# Patient Record
Sex: Female | Born: 1970 | Race: White | Hispanic: No | Marital: Single | Smoking: Never smoker
Health system: Southern US, Academic
[De-identification: ages and names within clinical notes are randomized; demographics above are authoritative.]

## PROBLEM LIST (undated history)

## (undated) DIAGNOSIS — M722 Plantar fascial fibromatosis: Secondary | ICD-10-CM

## (undated) DIAGNOSIS — O009 Unspecified ectopic pregnancy without intrauterine pregnancy: Secondary | ICD-10-CM

## (undated) DIAGNOSIS — C801 Malignant (primary) neoplasm, unspecified: Secondary | ICD-10-CM

## (undated) HISTORY — PX: HX BREAST BIOPSY: SHX20

## (undated) HISTORY — PX: HX OTHER: 2100001105

## (undated) HISTORY — DX: Unspecified ectopic pregnancy without intrauterine pregnancy: O00.90

## (undated) HISTORY — DX: Plantar fascial fibromatosis: M72.2

## (undated) HISTORY — PX: OTHER SURGICAL HISTORY: SHX169

---

## 1997-09-25 DIAGNOSIS — C439 Malignant melanoma of skin, unspecified: Secondary | ICD-10-CM

## 1997-09-25 HISTORY — DX: Malignant melanoma of skin, unspecified (CMS HCC): C43.9

## 1998-09-25 HISTORY — PX: HX REFRACTIVE SURGERY: SHX103

## 2003-09-26 HISTORY — PX: HX MYOMECTOMY: SHX85

## 2010-05-26 NOTE — Progress Notes (Signed)
Natasha Perez is a 39 y.o. female here to establish care.     Chief Complaint: Chief Complaint   Patient presents with   . Establish Care         HPI:    Sees Dr. Cephus Richer in [place] for ob/gyn. Just moved back to [place] after mom died and is keeping old doctor.  She also continues to see a derm in [place] for her history of melanoma.      No problems now but occasionally gets back spasms, sometimes during her hobby of scuba diving and needs flexeril.  Used to keep some but then it started expiring bc not being used often enough.     Works doing pediatric cardiology ultrasound, leaning over a lot, gives her some back pain.  Back goes out sometimes and has to take muscle relaxer or gets torticollis.       ZOX:WRUE Medical History   Diagnosis Date   . Melanoma 1999     right thigh, had 2 or 3 surgeries at [facility], sampled lymph nodes which were ok, still sees dermatologist yearly in [facility]       AVW:UJWJ Surgical History   Procedure Date   . Hx other      3 right leg surgeries for melanoma   . Hx myomectomy 2005   . Hx refractive surgery        Medications:  Current outpatient prescriptions   Medication Sig   . cholecalciferol, vitamin D3, 10,000 unit Cap take 5 Caps by mouth Q7 Days.   . multivitamin Tab take 1 Tab by mouth Once a day.         Allergies:Allergies   Allergen Reactions   . Sulfa (Sulfonamides) Nausea/ Vomiting   . Iodine-iodine Containing Itching, Swelling and Hives/ Urticaria       OB History    Grav Para Term Preterm Abortions TAB SAB Ect Mult Living    0 0                    Social history: History   Social History   . Marital Status: Single     Spouse Name: N/A     Number of Children: 0   . Years of Education: N/A   Occupational History   .  [employer].   Social History Main Topics   . Smoking status: Never Smoker    . Smokeless tobacco: Not on file   . Alcohol Use: No   . Drug Use: Not on file   . Sexually Active: Yes -- Female partner(s)   Other Topics Concern    . Exercise Concern No     Not getting as much exercise as she thinks she should.   Marland Kitchen Special Diet No     Only eating one meal a day now. Dinner is hit or miss.  Needs to eat more regularly she says.    Social History Narrative    Lives in [place].  No children.  Works here in pediatric cardiology. Brendolyn Patty is scuba diving.  Was an only child.  Zip lining for birthday. Got undergraduate degree at [employer] and a math degree at [employer].          Family history: Family History   Problem Relation Age of Onset   . Hypertension Mother    . Diabetes Mother      also in mother's grandmother   . Lung Cancer Maternal Grandmother    . Stroke  Mother 9     died of brainstem stroke       Review of systems:  Const: No constitutional symptoms of fevers, chills, or night sweats.  Eyes: No changes in visual acuity or diplopia now but had lasik surg.  ENT: No ear pain or sinus problems.    Skin: history of melanoma  GI: No abdominal pain, nausea or vomiting, black or bloody stools.  GU: Was irregular when younger, now is regular.  Cramps a lot so takes ibuprofen.  Tried COCPs for regulation.   Heme: No history of anemia or bleeding disorder.   Lungs: No dyspnea or wheezing.  Heart: No chest pain, palpitations, or history of heart murmur.  Musculoskeletal: +back pain  Psych: No history of depression or anxiety requiring treatment. Feels she is coping with her mother's death ok.    Physical Examination:    BP 122/86   Pulse 68   Temp(Src) 36.9 C (98.5 F) (Tympanic)   Ht 1.778 m (5\' 10" )   Wt 96.389 kg (212 lb 8 oz)   BMI 30.49 kg/m2   LMP 05/09/2010    Body mass index is 30.49 kg/(m^2).    General appearance  Alert, cooperative, no distress.   Head  Normocephalic, without obvious abnormality, atraumatic.   Eyes  Conjunctivae/corneas clear. PERRL, EOM's intact.    Ears  Normal TM's and external ear canals.   Nose Nares normal. Septum midline. Mucosa normal.    Throat Lips, mucosa, and tongue normal. Teeth and gums normal    Neck Supple, trachea midline, no adenopathy, thyroid: not enlarged, no nodules.   Lungs   Clear to auscultation bilaterally.   Heart  Regular rate and rhythm, S1, S2 normal, no murmur, rub or gallop.   Abdomen   Soft, non-tender. Bowel sounds normal. No masses,  No organomegaly.   Extremities Extremities normal, atraumatic, no cyanosis or edema.   Pulses 2+ and symmetric   Skin Skin color, texture, turgor normal. No rashes or lesions.   Neurologic Cranial nerves II-XII intact grossly.       Labs:  No results found for any previous visit.        Assessment/plan:  39 year old with history of melanoma that is followed annually by derm in [place] is here to establish:     Back pain at times.  May occasionally need flexeril she says.    Vitamin D deficiency. Taking 50,000 units weekly based on blood work she had done elsewhere.  She will try to get records for me.    Menstrual cramps and history of fibroid. S/p surg for fibroid.  Takes ibuprofen prn cramping and still sees her old ob/gyn in [place].    Weight. Usually around 180 pounds but has recently gained and is in obese range by bmi.  Body mass index is 30.49 kg/(m^2).  Weight: 96.389 kg (212 lb 8 oz)   Contraception. Not on any birth control and would love to get pregnant.  Advised to make sure she gets adequate folic acid just in case.    Prevention.  Last pap.  Does with gyn in [place].  Gets every year.  Had to repeat one once for abnormality and just had it May 2011.  Last mammogram.  Haven't started yet.  Other doctor ordered it but she hasn't done it yet. Ordered it here.   Colon cancer screening.  N/A.  Osteoporosis prevention.  No BMD.  Calcium encouraged.  Drinks a lot of milk.   Lipid screening.  Had it done and lipids were never an issue.    Immunizations.   Flu. Gets in the fall through work.   Pneumovax. N/A.   Tetanus. Is current. Doesn't know date though but says that employee health made her get adacel too. 2011.   Shingles. N/A.      Orders Placed This Encounter   . MAMMO BILATERAL SCREENING       There are no Patient Instructions on file for this visit.    Follow-up in 1 year or prn.        Christianne Dolin, MD, MPH  Associate Professor  [place] [facility] Medicine  Clinic Director, [facility]

## 2010-05-27 ENCOUNTER — Other Ambulatory Visit (INDEPENDENT_AMBULATORY_CARE_PROVIDER_SITE_OTHER): Payer: Self-pay | Admitting: Internal Medicine

## 2010-05-27 ENCOUNTER — Ambulatory Visit (INDEPENDENT_AMBULATORY_CARE_PROVIDER_SITE_OTHER): Payer: BLUE CROSS/BLUE SHIELD | Admitting: Internal Medicine

## 2010-05-27 ENCOUNTER — Ambulatory Visit (HOSPITAL_COMMUNITY): Payer: BLUE CROSS/BLUE SHIELD

## 2010-05-27 ENCOUNTER — Ambulatory Visit
Admission: RE | Admit: 2010-05-27 | Discharge: 2010-05-27 | Disposition: A | Discharge status: Home / Self Care | Payer: BLUE CROSS/BLUE SHIELD | Attending: Internal Medicine | Admitting: Internal Medicine

## 2010-05-27 ENCOUNTER — Encounter (INDEPENDENT_AMBULATORY_CARE_PROVIDER_SITE_OTHER): Payer: Self-pay | Admitting: Internal Medicine

## 2010-05-27 ENCOUNTER — Ambulatory Visit (HOSPITAL_BASED_OUTPATIENT_CLINIC_OR_DEPARTMENT_OTHER)
Admission: RE | Admit: 2010-05-27 | Discharge: 2010-05-27 | Disposition: A | Discharge status: Home / Self Care | Payer: BLUE CROSS/BLUE SHIELD | Source: Ambulatory Visit | Attending: Internal Medicine | Admitting: Internal Medicine

## 2010-05-27 VITALS — BP 122/86 | HR 68 | Temp 98.5°F | Ht 70.0 in | Wt 212.5 lb

## 2010-05-27 DIAGNOSIS — Z1231 Encounter for screening mammogram for malignant neoplasm of breast: Secondary | ICD-10-CM | POA: Insufficient documentation

## 2010-05-27 DIAGNOSIS — R928 Other abnormal and inconclusive findings on diagnostic imaging of breast: Secondary | ICD-10-CM

## 2010-05-27 DIAGNOSIS — Z85820 Personal history of malignant melanoma of skin: Secondary | ICD-10-CM | POA: Insufficient documentation

## 2010-07-19 ENCOUNTER — Encounter: Payer: Self-pay | Admitting: Internal Medicine

## 2010-07-20 ENCOUNTER — Encounter (INDEPENDENT_AMBULATORY_CARE_PROVIDER_SITE_OTHER): Payer: Self-pay | Admitting: Internal Medicine

## 2010-07-20 NOTE — Telephone Encounter (Signed)
Message copied by Raford Pitcher on Wed Jul 20, 2010 11:48 AM  ------       Message from: Richrd Prime       Created: Wed Jul 20, 2010  9:02 AM       Regarding: FW: Prescription Question       Contact: [phone]                       ----- Message -----          From: Quentin Angst          Sent: 07/20/2010   8:53 AM            To: [facility]       Subject: Prescription Question                                      Good morning maam,              I went to refill my perscription of vitD 50000 units at the [facility] pharmacy and they wouldnt refill it because the year had expired from my previous PCP([place]) although there were still refills.              I recently moved here and got established with you back in Sept could you please write me a perscription for:               VIT D 50000 units SOFT Gel (1 capsule every week) with refills.                Any questions please feel free to call me [phone].                     Thanks for your time       Make it a great day!!!!!              Natasha Perez :)

## 2010-07-21 ENCOUNTER — Ambulatory Visit
Admission: RE | Admit: 2010-07-21 | Discharge: 2010-07-21 | Disposition: A | Discharge status: Home / Self Care | Payer: BLUE CROSS/BLUE SHIELD | Attending: Internal Medicine | Admitting: Internal Medicine

## 2010-07-21 ENCOUNTER — Encounter (INDEPENDENT_AMBULATORY_CARE_PROVIDER_SITE_OTHER): Payer: Self-pay | Admitting: Internal Medicine

## 2010-07-21 ENCOUNTER — Ambulatory Visit (INDEPENDENT_AMBULATORY_CARE_PROVIDER_SITE_OTHER): Payer: Self-pay | Admitting: Internal Medicine

## 2010-07-21 ENCOUNTER — Ambulatory Visit: Payer: Self-pay

## 2010-07-21 DIAGNOSIS — E559 Vitamin D deficiency, unspecified: Secondary | ICD-10-CM | POA: Insufficient documentation

## 2010-07-21 DIAGNOSIS — Z139 Encounter for screening, unspecified: Secondary | ICD-10-CM | POA: Insufficient documentation

## 2010-07-21 LAB — GLUCOSE FASTING: GLUCOSE, FASTING: 92 mg/dL (ref 70–105)

## 2010-07-21 LAB — LIPID PANEL
CHOLESTEROL: 142 mg/dL (ref <200)
HDL-CHOLESTEROL: 52 mg/dL (ref >39)
LDL (CALCULATED): 79 mg/dL (ref <100)
NON - HDL (CALCULATED): 90 mg/dL (ref <190)
TRIGLYCERIDES: 53 mg/dL (ref <150)
VLDL (CALCULATED): 11 mg/dL (ref <30)

## 2010-07-21 NOTE — Telephone Encounter (Signed)
Message copied by Richrd Prime on Thu Jul 21, 2010  2:44 PM  ------       Message from: Durenda Age       Created: Thu Jul 21, 2010  1:53 PM         >> [facility] 07/21/2010 01:53 PM       Dr.Davisson       Lab at the hospital is calling stating that the order for the vitamin d they dont have a gold and cannot run that on the pt and they wanted to let you know that

## 2010-07-22 ENCOUNTER — Telehealth (INDEPENDENT_AMBULATORY_CARE_PROVIDER_SITE_OTHER): Payer: Self-pay | Admitting: Internal Medicine

## 2010-07-22 MED ORDER — ERGOCALCIFEROL (VITAMIN D2) 1,250 MCG (50,000 UNIT) CAPSULE
50000.00 [IU] | ORAL_CAPSULE | ORAL | Status: DC
Start: 2010-07-22 — End: 2011-08-16

## 2010-07-22 NOTE — Telephone Encounter (Signed)
Message copied by Raford Pitcher on Fri Jul 22, 2010  2:02 PM  ------       Message from: Luanne Bras       Created: Fri Jul 22, 2010 12:41 PM       Regarding: RE: Non-Urgent Medical Question       Contact: [phone]         I had my blood drawn yesterday afternoon(they had to stick me 4 times for it)  if by chance you would like to run any other tests off that tube of blood I would appreciate it!!!!...Marland KitchenMarland KitchenHave you received the results.... I really would like to get my prescription renewal today since its the wkend and i usually take the vit d on wed...so Im a little late this week.              Thanks again and so sorry to bother so much regarding this minor issue.Marland KitchenMarland KitchenMarland KitchenIm sure you have much bigger issues to deal with.              Make it a great day!!!  Natasha Perez :)              ----- Message -----          From: Raford Pitcher, MD          Sent: 07/21/2010 4:32 PM            To: Natasha Perez       Subject: RE: Non-Urgent Medical Question               I sent you another message saying that I would try to add it on but the lab called and said they don't have the right tube so you will need to be stuck anyway.                       ----- Message -----          From: Natasha Perez          Sent: 07/21/10 01:41 PM            To: Raford Pitcher, MD       Subject: Non-Urgent Medical Question              will do!!  Have a great afternoon :)              ----- Message -----          From: Raford Pitcher, MD          Sent: 07/21/2010 1:35 PM            To: Natasha Perez       Subject: RE: Non-Urgent Medical Question                No, we can't do that to my knowledge.  Blood work that you have done through the wellness program doesn't go into your chart as a patient so that's why it won't work.  In other words, I can't add blood work as a "blood in lab" because according to your patient record, there is no blood in the lab from today.  Sorry!  I hate that you have to get stuck again.  (And for the same reason, please get me a copy of the results from the wellness program because otherwise I won't see them.)                            -----  Message -----          From: Natasha Perez          Sent: 07/21/10 12:49 PM            To: Raford Pitcher, MD       Subject: Non-Urgent Medical Question              Good Afternoon,                Question for ya....i had blood drawn this am(fasting) for the wellness thing....can u run the vit D off of that same tube without me having to be stuck again today....please let me know.Marland KitchenMarland KitchenThanks for your time Spiro :)

## 2010-07-25 ENCOUNTER — Encounter (INDEPENDENT_AMBULATORY_CARE_PROVIDER_SITE_OTHER): Payer: Self-pay | Admitting: Internal Medicine

## 2010-07-25 LAB — VITAMIN D, SERUM (25 HYDROXYVITAMIN D2 AND D3 BY MS)
25 HYDROXYVITAMIN D2/D3,total: 52.9 ng/mL
25 HYDROXYVITAMIN D2: 47.9 ng/mL
25 HYDROXYVITAMIN D3: 5 ng/mL

## 2010-10-14 ENCOUNTER — Ambulatory Visit: Payer: 59 | Attending: Internal Medicine | Admitting: Internal Medicine

## 2010-10-14 ENCOUNTER — Encounter (INDEPENDENT_AMBULATORY_CARE_PROVIDER_SITE_OTHER): Payer: Self-pay | Admitting: Internal Medicine

## 2010-10-14 VITALS — BP 132/88 | HR 64 | Temp 97.6°F | Ht 70.0 in | Wt 207.0 lb

## 2010-10-14 DIAGNOSIS — M62838 Other muscle spasm: Secondary | ICD-10-CM | POA: Insufficient documentation

## 2010-10-14 DIAGNOSIS — M25519 Pain in unspecified shoulder: Secondary | ICD-10-CM | POA: Insufficient documentation

## 2010-10-14 MED ORDER — METAXALONE 800 MG TABLET
800.00 mg | ORAL_TABLET | Freq: Three times a day (TID) | ORAL | Status: DC
Start: 2010-10-14 — End: 2011-02-22

## 2010-10-14 MED ORDER — CYCLOBENZAPRINE 10 MG TABLET
10.00 mg | ORAL_TABLET | Freq: Three times a day (TID) | ORAL | Status: DC
Start: 2010-10-14 — End: 2011-08-30

## 2010-10-14 MED ORDER — ETODOLAC 400 MG TABLET
400.00 mg | ORAL_TABLET | Freq: Two times a day (BID) | ORAL | Status: DC
Start: 2010-10-14 — End: 2011-02-22

## 2010-10-14 NOTE — Progress Notes (Signed)
Subjective:      Natasha Perez is a 40 y.o. female here for acute visit for right sided shoulder/back/neck muscle spasm.  This is the same thing that has happened to her multiple times in the past.  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  She says that right now it is not at its worst.  She reports the pain was an 8 or 9 out of 10.  Now it is down to a 5, after taking a friend's skelaxin.  She reports that she has iced it and has taken ibuprofen 800mg  every 4 hours with minimal relief.  She reports that flexeril usually works the best but it is hard for her to work on it.  The skelaxin seems to not make her feel as loopy.  She reports that in the past she has done PT for this problem and has some exercises that she tries to continue to do.  She reports that she hopes they will let her exchange tasks at work with somebody else this afternoon.  If not, and if she has to do the portables, she thinks she will have to take off the rest of the afternoon.  She plans to rest and recover over the weekend.  She wanted Monday off in case she needs it, but she wants to return to work on Monday if she is better.       Patient's last menstrual period was 09/25/2010.        Current outpatient prescriptions   Medication Sig   . cyclobenzaprine (FLEXERIL) 10 mg Oral Tablet take 1 Tab by mouth Three times a day.   . Metaxalone 800 mg Oral Tablet take 800 mg by mouth Three times a day.   . Etodolac (LODINE) 400 mg Oral Tablet take 1 Tab by mouth Twice daily.   . ergocalciferol, vitamin D2, (DRISDOL) 50,000 unit Oral Capsule take 1 Cap by mouth Q7 Days.   . cholecalciferol, vitamin D3, 10,000 unit Cap take 5 Caps by mouth Q7 Days.   . multivitamin Tab take 1 Tab by mouth Once a day.           Objective:     BP 132/88   Pulse 64   Temp(Src) 36.4 C (97.6 F) (Tympanic)   Ht 1.778 m (5\' 10" )   Wt 93.895 kg (207 lb)   BMI 29.70 kg/m2   LMP 09/25/2010     Body mass index is 29.70 kg/(m^2).       General: Visibly stiff.   Pain with palpation of the right side of her back in the mid-scapula area.  Not tender in the right neck.    Range of motion of her neck is limited: she can turn to the left and right only to about 45 degrees.        Labs:  Hospital Outpatient Visit on 07/21/2010   Component Date Value   . 25 HYDROXYVITAMIN D2 07/21/2010 47.9    . 25 HYDROXYVITAMIN D3 07/21/2010 5.0    . 25 HYDROXYVITAMIN D2/D3,* 07/21/2010 52.9    Office Visit on 07/21/2010   Component Date Value   . GLUCOSE, FASTING 07/21/2010 92    . TRIGLYCERIDES 07/21/2010 53    . CHOLESTEROL 07/21/2010 142    . HDL-CHOLESTEROL 07/21/2010 52    . LDL (CALCULATED) 07/21/2010 79    . VLDL (CALCULATED) 07/21/2010 11    . NON - HDL (CALCULATED) 07/21/2010 90  Assessment/Plan:     40 year old woman with a history of melanoma is here for an acute visit for right sided muscle spasm.  She was given a prescription for lodine to try instead of the ibuprofen.  It was emphasized to her to not combine the two meds.  She can combine the nsaid with flexeril though and the flexeril prescription was also given.  A few skelaxin were also given because she feels less loopy on them, but it was emphasized to her not to combine the flexeril and skelaxin.  A work excuse was given.  If she doesn't improve next week and would like to try something else like physical therapy again, she is to contact me.         Orders Placed This Encounter   . cyclobenzaprine (FLEXERIL) 10 mg Oral Tablet   . Metaxalone 800 mg Oral Tablet   . Etodolac (LODINE) 400 mg Oral Tablet   . RETURN TO WORK/SCHOOL         There are no Patient Instructions on file for this visit.    Follow-up prn.     Christianne Dolin, MD, MPH  Associate Professor  [place] Department of Medicine  Clinic Director, [facility]

## 2011-02-22 ENCOUNTER — Ambulatory Visit (INDEPENDENT_AMBULATORY_CARE_PROVIDER_SITE_OTHER): Payer: 59

## 2011-02-22 ENCOUNTER — Encounter (INDEPENDENT_AMBULATORY_CARE_PROVIDER_SITE_OTHER): Payer: Self-pay

## 2011-02-22 VITALS — BP 124/87 | HR 68 | Temp 97.9°F | Resp 18 | Wt 216.1 lb

## 2011-02-22 MED ORDER — VALACYCLOVIR 1 GRAM TABLET
1000.00 mg | ORAL_TABLET | ORAL | Status: AC
Start: 2011-02-22 — End: 2011-03-01

## 2011-02-22 MED ORDER — PREDNISONE 20 MG TABLET
20.00 mg | ORAL_TABLET | ORAL | Status: DC
Start: 2011-02-22 — End: 2011-06-28

## 2011-02-22 NOTE — Patient Instructions (Signed)
 Shingles  (Herpes Zoster)  Shingles is caused by the same virus that causes chicken pox. The first feelings may be pain or tingling. A rash will follow in a couple days. The rash may occur on any area of the body. Long-lasting pain is more likely in an elderly person. It can last months to years. There are medicines that can help prevent pain if you start taking them early.  HOME CARE   Place cool cloths on the rash.    Only take medicine as told by your doctor.    Calamine lotion can be used to relieve itchy skin.    Avoid touching:    Babies.    Children with inflamed skin (eczema).    People who have gotten transplanted organs.    People with chronic illnesses, such as leukemia and AIDS.    If the rash is on the face, you may be told to see a specialist. It is very important to keep all appointments. Shingles must be kept away from the eyes, if possible.   GET HELP RIGHT AWAY IF:   There is any pain on the face or eye. This must be followed carefully by your doctor or eye doctor. An infection of part of the eye (cornea) can be very serious. It could lead to blindness.    The medicines do not help.    The redness or puffiness (swelling) spreads.    You or your child has a temperature by mouth above 101, not controlled by medicine.    Your baby is older than 3 months with a rectal temperature of 102 F (38.9 C) or higher.    Your baby is 47 months old or younger with a rectal temperature of 100.4 F (38 C) or higher.    You notice any red lines going away from the rash area.   MAKE SURE YOU:   Understand these instructions.    Will watch this condition.    Will get help right away if you or your child is not doing well or gets worse.   Document Released: 02/28/2008 Document Re-Released: 12/06/2009  [facility] Patient Information 2011 [place], [place].    Orders Placed This Encounter   . Valacyclovir  (VALTREX ) 1 g Oral Tablet   . predniSONE  (DELTASONE ) 20 mg Oral Tablet           ________________________________________________________________________  Short Term Disability and Family Medical Leave Act  [facility] does NOT provide assistance with any disability applications.  If you feel your medical condition requires you to be on disability, you will need to follow up with  Your primary care physician or a specialist.  We apologize for any inconvenience.    For Medication Prescribed by [facility]:  As an Urgent Care facility, our clinic does NOT offer prescription refills over the telephone.    If you need more of the medication one of our medical providers prescribed, you will  Either need to be re-evaluated by us  or see your primary care physician.    ________________________________________________________________________      It is very important that we have a phone number that is the single best way to contact you in the event that we become aware of important clinical information or concerns after your discharge.  If the phone number you provided at registration is NOT this number you should inform staff and registration prior to leaving.      Your treatment and evaluation today was focused on identifying and treating  potentially emergent conditions based on your presenting signs, symptoms, and history.  The resulting initial clinical impression and treatment plan is not intended to be definitive or a substitute for a full physical examination and evaluation by your primary care provider.  If your symptoms persist, worsen, or you develop any new or concerning symptoms, you need to be evaluated.      If you received x-rays during your visit, be aware that the final and formal interpretation of those films by a radiologist may occur after your discharge.  If there is a significant discrepancy identified after your discharge, we will contact you at th telephone number provided at registration.      If you received a pelvic exam, you may have cultures pending for sexually  transmitted diseases.  Positive cultures are reported to the [facility] Department of Health as required by state law.  You should be contacted if you cultures are positive.  We will not contact you if they are negative.  You may contact the Health Information Management Office of [facility] to get a copy of your results.  You did NOT receive a PAP smear (the screening test for cervical).  This specific test for women is best performed by your gynecologist or primary care provider when indicated.      If you are over 53 year old, we cannot discuss your personal health information with a parent, spouse, family member, or anyone else without your express consent.  This does not include those who have legitimate access to your records and information to assist in your care under the provisions of HIPAA [facility] Portability and Accountability Act) law, or those to whom you have previously given express written consent to do so, such a legal guardian or Power of [place].      You may have received medication that may cause you to feel drowsy and/or light headed for several hours.  You may even experience some amnesia of your stay.  You should avoid operating a motor vehicle or performing any activity requiring complete alertness or coordination until you feel fully awake (approximately 24-48 hours).  Avoid alcoholic beverages.  You may also have a dry mouth for several hours.  This is a normal side effect and will disappear as the effects of the medication wear off.      Instructions discussed with patient upon discharge by clinical staff with all questions answered.  Please call [facility] [phone]) if any further questions.  Go immediately to the emergency department if any concern or worsening symptoms.        Tiron Suski Marie Lora Glomski, PA-C 02/22/2011, 9:30 AM

## 2011-02-22 NOTE — Progress Notes (Signed)
History of Present Illness: Natasha Perez is a 40 y.o. female who presents to the Urgent Care today with chief complaint of Shingles.  Patient states she began 5 days ago with bad headache and feeling fatigue.  She developed pain along right side of face and ear.  Two days ago she developed small blisters around right ear lobe and behind ear.  She notes pain associated with rash.  Patient has taken ibuprofen for pain and has history of varicella.    I reviewed and confirmed the patient's past medical history taken by the nurse or medical assistant with the addition of the following:    Past Medical History:    Past Medical History   Diagnosis Date   . Melanoma 1999     right thigh, had 2 or 3 surgeries at [facility], sampled lymph nodes which were ok, still sees dermatologist yearly in [place]       Past Surgical History:    Past Surgical History   Procedure Date   . Hx other      3 right leg surgeries for melanoma   . Hx myomectomy 2005   . Hx refractive surgery        Allergies:  Allergies   Allergen Reactions   . Sulfa (Sulfonamides) Nausea/ Vomiting   . Iodine-iodine Containing Itching, Swelling and Hives/ Urticaria       Medications:    Current outpatient prescriptions   Medication Sig   . prenatal vitamin-iron-folate (NATALCARE PLUS) 27-0.8 mg Oral Tablet take 1 Tab by mouth Once a day.   . Valacyclovir (VALTREX) 1 g Oral Tablet take 1 Tab by mouth for 7 days. Valtrex 1g - one tablet three times daily for 7 days   . predniSONE (DELTASONE) 20 mg Oral Tablet take 1 Tab by mouth. Take 3 tabs x 2 days, 2 tabs x 2 days, 1 tab x 2 days, then finish with 1/2 tab x 1 day   . ergocalciferol, vitamin D2, (DRISDOL) 50,000 unit Oral Capsule take 1 Cap by mouth Q7 Days.   . multivitamin Tab take 1 Tab by mouth Once a day.   . cyclobenzaprine (FLEXERIL) 10 mg Oral Tablet take 1 Tab by mouth Three times a day.   Marland Kitchen DISCONTD: Metaxalone 800 mg Oral Tablet take 800 mg by mouth Three times a day.    Marland Kitchen DISCONTD: Etodolac (LODINE) 400 mg Oral Tablet take 1 Tab by mouth Twice daily.   Marland Kitchen DISCONTD: cholecalciferol, vitamin D3, 10,000 unit Cap take 5 Caps by mouth Q7 Days.       Social History:    History   Substance Use Topics   . Smoking status: Never Smoker    . Smokeless tobacco: Not on file   . Alcohol Use: No       Family History: No significant family history.  Family History   Problem Relation Age of Onset   . Hypertension Mother    . Diabetes Mother      also in mother's grandmother   . Stroke Mother 11     died of brainstem stroke   . Lung Cancer Maternal Grandmother          Review of Systems:  General: no fever and no myalgias  Pulmonary: no dry cough and no SOB  Cardiovascular: no chest pain  Musculoskeletal: pain along right side of face and ear  Neurologic: no paresthesias, no weakness and headache  Skin:      rash  All other review  of systems were negative    Physical Exam:  Vital signs:   Filed Vitals:    02/22/11 0859   BP: 124/87   Pulse: 68   Temp: 36.6 C (97.9 F)   TempSrc: Tympanic   Resp: 18   Weight: 98 kg (216 lb 0.8 oz)   SpO2: 98%       General: Well appearing and No acute distress  Eyes: Normal lids/lashes, PERRL, EOMI and normal conjunctiva  ENT: normal EAC's and normal TM's  Neck: supple and  cervical and anterior lymphadenopathy  Skin: vesicular rash behind right ear with scabs along right ear lobe, minimal swelling noted    Data Reviewed  Not applicable    Course: Condition at discharge: Good and Stable    Differential Diagnosis: herpes zoster    Assessment: 1. Herpes zoster (053.9H)        Plan:  Orders Placed This Encounter   . Valacyclovir (VALTREX) 1 g Oral Tablet   . predniSONE (DELTASONE) 20 mg Oral Tablet        Plan was discussed and patient verbalized understanding.  If symptoms are worsening or not improving the patient should return to the Urgent Care for further evaluation.   Supervising physician was physically present in Urgent Care and available for consultation and did not participate in the care of this patient.   Kelton Pillar, PA-C 02/22/2011, 9:34 AM

## 2011-05-31 ENCOUNTER — Encounter (INDEPENDENT_AMBULATORY_CARE_PROVIDER_SITE_OTHER): Payer: 59 | Admitting: Internal Medicine

## 2011-06-02 ENCOUNTER — Ambulatory Visit (INDEPENDENT_AMBULATORY_CARE_PROVIDER_SITE_OTHER): Payer: 59 | Admitting: Internal Medicine

## 2011-06-26 ENCOUNTER — Encounter (INDEPENDENT_AMBULATORY_CARE_PROVIDER_SITE_OTHER): Payer: Self-pay | Admitting: Internal Medicine

## 2011-06-28 ENCOUNTER — Encounter (INDEPENDENT_AMBULATORY_CARE_PROVIDER_SITE_OTHER): Payer: Self-pay | Admitting: GENERAL

## 2011-06-28 ENCOUNTER — Ambulatory Visit: Payer: 59 | Attending: Internal Medicine | Admitting: GENERAL

## 2011-06-28 VITALS — BP 130/80 | HR 80 | Temp 98.5°F | Ht 70.5 in | Wt 217.0 lb

## 2011-06-28 MED ORDER — AZITHROMYCIN 250 MG TABLET
250.00 mg | ORAL_TABLET | ORAL | Status: AC
Start: 2011-06-28 — End: 2011-07-02

## 2011-06-28 MED ORDER — CITALOPRAM 20 MG TABLET
20.0000 mg | ORAL_TABLET | Freq: Every day | ORAL | Status: DC
Start: 2011-06-28 — End: 2011-08-30

## 2011-06-28 NOTE — Telephone Encounter (Signed)
Message copied by Raford Pitcher on Wed Jun 28, 2011  3:33 PM  ------       Message from: Caroll Rancher D       Created: Mon Jun 26, 2011 12:25 PM       Regarding: FW: Non-Urgent Medical Question       Contact: [phone]                       ----- Message -----          From: Quentin Angst          Sent: 06/26/2011  11:46 AM            To: [facility]       Subject: Non-Urgent Medical Question                                I need an order for my annual mammogram please.  I would like to schedule it either this week or next week when I am in town working. Then have the results and follow up with an appt with you.              Thanks you for your time.       Have a great day!!       Patric:)              Any questions please call [phone]

## 2011-06-29 ENCOUNTER — Encounter (INDEPENDENT_AMBULATORY_CARE_PROVIDER_SITE_OTHER): Payer: Self-pay | Admitting: GENERAL

## 2011-06-29 ENCOUNTER — Ambulatory Visit (INDEPENDENT_AMBULATORY_CARE_PROVIDER_SITE_OTHER): Payer: Self-pay | Admitting: GENERAL

## 2011-06-29 NOTE — Telephone Encounter (Signed)
Message copied by Jorene Minors on Thu Jun 29, 2011  3:47 PM  ------       Message from: Luanne Bras       Created: Thu Jun 29, 2011  2:21 PM       Regarding: Non-Urgent Medical Question       Contact: [phone]         I left a message with the receptionist first thing this morning for you to call me.  It was late in the day yesterday and I have some unanswered questions, plus I still need assistance regarding FMLA papers. Please give me a call today if possible. [phone]              There seem to be an issue with my manager regarding the work excuse also.         Thanks, Jenne Pane

## 2011-06-29 NOTE — Telephone Encounter (Signed)
Message copied by Erlene Quan on Thu Jun 29, 2011 12:55 PM  ------       Message from: Carleene Mains       Created: Thu Jun 29, 2011  8:06 AM         >> Carleene Mains 06/29/2011 08:06 AM       Dr.Buddenberg       Pt is calling stating that there where somethings that werent put on her fmla papers and she needs to speak with you about this.Please call pt back she states she needs these turned in today.Please call pt back at [phone]

## 2011-06-30 NOTE — Progress Notes (Addendum)
Subjective:     Patient ID:  Natasha Perez is an 40 y.o. female     Chief Complaint:    Chief Complaint   Patient presents with   . URI   . Depression       HPI  Patient is a 40 yo female who is an established clinic patient but new to me who presents for an acute visit for upper respiratory infection symptoms and depression/emotional crisis.  She complains of a 6 day history of nasal congestion and drainage that has progressed to a cough and mild difficulty breathing.  She denies fever/chills, productive cough.  She is not short of breath but if she takes a deep breath she starts to cough.  She started taking azithromycin that a friend had left over and gave her.  She took 500 mg yesterday and 250 mg today.  She has been taking some otc supplements--zinc and vit C.  She feels that she has been having repeated episodes of these URI symptoms over the past 3 months.  She has been under a lot of stress.  She drives 2 hours each way to work.  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  She has no family left as was the only child and both parents died in their 70's.  Her mother died 2 years ago and she feels she is still having a lot of grief with that death.  She lives in her mother's home and all her belongings are there that are always reminding her of her mother.  She says she has difficulty sleeping during the work week and then when she is off she sleeps most of the day and has no motivation to do anything else, does not even care about showering when she doesn't work.  She has no appetite.  She has friends as support but they are busy with their lives.  She denies being suicidal but says she doesn't think anyone would care if she were here or not.  She does not think she can work anymore like this but has to work, can not financially quit.  She thinks if she was able to have some time off to get things together she would be ok.    Review of Systems    Constitutional: Positive for malaise/fatigue.   HENT: Positive for ear pain and congestion.    Eyes:        Watery eyes   Respiratory: Positive for cough and sputum production. Negative for hemoptysis, shortness of breath and wheezing.    Cardiovascular: Negative.    Gastrointestinal: Negative.    Genitourinary: Negative.    Musculoskeletal: Negative.    Skin: Negative.    Neurological: Positive for headaches.   Endo/Heme/Allergies: Negative.    Psychiatric/Behavioral: Positive for depression. Negative for suicidal ideas, hallucinations and substance abuse. The patient has insomnia. The patient is not nervous/anxious.         Poor appetite         Objective:     Physical Exam   Constitutional: She is oriented to person, place, and time. She appears well-developed and well-nourished. She appears distressed.   HENT:   Head: Normocephalic and atraumatic.   Right Ear: External ear normal.   Left Ear: External ear normal.   Mouth/Throat: Oropharynx is clear and moist.        Nasal septum and chonchae erythematous and boggy   Eyes: Conjunctivae and EOM are  normal. Pupils are equal, round, and reactive to light.   Neck: Normal range of motion. Neck supple.   Cardiovascular: Normal rate, regular rhythm and normal heart sounds.    Pulm:  Effort normal and breath sounds normal.  Observations:  no respiratory distress.    There are no rales present.    There are no wheezes present.  The chest exhibits no tenderness.    Abdomen:   Bowel sounds are normal. Soft. No tenderness.   Ortho/Musculoskeletal:   Normal range of motion. She exhibits no edema and no tenderness.   Lymphadenopathy:     She has no cervical adenopathy.   Neurological: She is alert and oriented to person, place, and time.   Skin: Skin is warm and dry. She is not diaphoretic.   Psychiatric:        tearful   Psych (det) : The patient has no hallucinations.        .    Assessment & Plan:     1. Depression (311)  RETURN TO WORK/SCHOOL     URI:   -continue azithromycin 250 mg for a total of 5 days, has already taken for 2 days, prescribed for 3 more days  -take ibuprofen for symptoms and nasal saline spray    Depression/emotional crisis/ physical exhaustion:  -start celexa 20 mg daily  -was advised to seek medical care if she becomes suicidal  -was given work excuse until 07/10/11  -patient declined psychiatrist referral  -encouraged patient to find a way to not have to commute 4 hours a day and have two jobs  -encouraged patient to reach out to friends for support    Follow up in one month or sooner as needed.    Olin Pia, D.O. 06/30/2011 5:33 AM  PGY-3  Department of Internal Medicine  [facility]    I discussed the patient's care in detail with Dr. Stacie Glaze prior to the patient leaving the clinic. Any significant discussion points are noted.    Jeffrey L. Dorothyann Gibbs, MD   Professor of Internal Medicine  Section of General Internal Medicine   [facility]

## 2011-07-03 ENCOUNTER — Ambulatory Visit (INDEPENDENT_AMBULATORY_CARE_PROVIDER_SITE_OTHER): Payer: Self-pay | Admitting: Internal Medicine

## 2011-07-03 NOTE — Telephone Encounter (Signed)
Message copied by Evelina Dun on Mon Jul 03, 2011  1:08 PM  ------       Message from: Gibson Ramp       Created: Mon Jul 03, 2011 12:18 PM         >> Gibson Ramp 07/03/2011 12:18 PM       K.Buddenberg       Domingo Pulse from [employer] is requesting information concerning the pt time off work. Please return her call to [phone] ext [phone]

## 2011-07-04 ENCOUNTER — Ambulatory Visit (INDEPENDENT_AMBULATORY_CARE_PROVIDER_SITE_OTHER): Payer: Self-pay | Admitting: Internal Medicine

## 2011-07-04 NOTE — Telephone Encounter (Signed)
Message copied by Erlene Quan on Tue Jul 04, 2011 11:14 AM  ------       Message from: Venia Carbon       Created: Tue Jul 04, 2011  8:42 AM       Regarding: RETURN PT'S PHONE CALL ABOUT OFFICE VISIT OCT 3         >> DEBRA LOWTHER 07/04/2011 08:44 AM       DR Stacie Glaze  Pt needs to speak with you about her visit on Jun 28, 2011 and rx.  Messages also left about [employer] paperwork.  Please return her phone call.  Thank you.       >> DEBRA LOWTHER 07/04/2011 08:44 AM       DR Stacie Glaze  Pt needs to speak with you about her visit on Jun 28, 2011 and rx.  Messages also left about [employer] paperwork.  Please return her phone call.  Thank you.       >> DEBRA LOWTHER 07/04/2011 08:42 AM       DR Stacie Glaze  Pt needs to speak with you about her visit on Jun 28, 2011 and rx.  Messages also left about [employer] paperwork.  Please return her phone call.  Thank you.

## 2011-07-05 ENCOUNTER — Encounter (INDEPENDENT_AMBULATORY_CARE_PROVIDER_SITE_OTHER): Payer: Self-pay | Admitting: Internal Medicine

## 2011-07-05 NOTE — Progress Notes (Signed)
 Faxed paperwork to [employer] at [phone] per dr unice request

## 2011-07-05 NOTE — Telephone Encounter (Signed)
Message copied by Jorene Minors on Wed Jul 05, 2011  8:50 AM  ------       Message from: Burnell Blanks A       Created: Wed Jul 05, 2011  8:05 AM         >> Pamala Hurry 07/05/2011 08:05 AM       Dr.Buddenberg       Pt is calling stating that there where somethings that werent put on her fmla papers and she needs to speak with you about this.Please call pt back she states she needs these turned in today.Please call pt back at [phone]

## 2011-07-06 ENCOUNTER — Encounter (INDEPENDENT_AMBULATORY_CARE_PROVIDER_SITE_OTHER): Payer: Self-pay | Admitting: GENERAL

## 2011-07-06 ENCOUNTER — Other Ambulatory Visit (INDEPENDENT_AMBULATORY_CARE_PROVIDER_SITE_OTHER): Payer: Self-pay | Admitting: Internal Medicine

## 2011-07-06 ENCOUNTER — Ambulatory Visit (HOSPITAL_BASED_OUTPATIENT_CLINIC_OR_DEPARTMENT_OTHER)
Admission: RE | Admit: 2011-07-06 | Discharge: 2011-07-06 | Disposition: A | Discharge status: Home / Self Care | Payer: 59 | Source: Ambulatory Visit | Attending: Internal Medicine | Admitting: Internal Medicine

## 2011-07-06 ENCOUNTER — Ambulatory Visit
Admission: RE | Admit: 2011-07-06 | Discharge: 2011-07-06 | Disposition: A | Discharge status: Home / Self Care | Payer: 59 | Source: Ambulatory Visit | Attending: Internal Medicine | Admitting: Internal Medicine

## 2011-07-06 ENCOUNTER — Other Ambulatory Visit (INDEPENDENT_AMBULATORY_CARE_PROVIDER_SITE_OTHER): Payer: Self-pay | Admitting: GENERAL

## 2011-07-06 ENCOUNTER — Ambulatory Visit (INDEPENDENT_AMBULATORY_CARE_PROVIDER_SITE_OTHER): Payer: Self-pay | Admitting: GENERAL

## 2011-07-06 DIAGNOSIS — Z85820 Personal history of malignant melanoma of skin: Secondary | ICD-10-CM | POA: Insufficient documentation

## 2011-07-06 DIAGNOSIS — N63 Unspecified lump in unspecified breast: Secondary | ICD-10-CM | POA: Insufficient documentation

## 2011-07-06 DIAGNOSIS — Z1231 Encounter for screening mammogram for malignant neoplasm of breast: Secondary | ICD-10-CM | POA: Insufficient documentation

## 2011-07-06 NOTE — Telephone Encounter (Signed)
Message copied by Evelina Dun on Thu Jul 06, 2011  1:00 PM  ------       Message from: [place], [place]       Created: Thu Jul 06, 2011  9:07 AM       Regarding: pt requesting to speak with the doctor         >> ROBIN MARI 07/06/2011 09:07 AM       Dr. Stacie Glaze : the patient states she has left several messages and still has been unable to speak with the doctor, she is requesting that you call her back at her home or mobile number...she wants to discuss the FMLA form, the medications and some other issues.

## 2011-07-06 NOTE — Telephone Encounter (Signed)
Message copied by Evelina Dun on Thu Jul 06, 2011  1:04 PM  ------       Message from: Burnell Blanks A       Created: Thu Jul 06, 2011 10:01 AM         >> Pamala Hurry 07/06/2011 10:01 AM       Christianne Dolin, MD       Pt states she has called and left several messages she is very upset , she states she is coming to the clinic today if someone doesn't        Call her back with in the next hour.

## 2011-07-06 NOTE — Progress Notes (Addendum)
 Patient was called.  FMLA papers were filled out and faxed yesterday.  She is requesting a work excuse or copy of FMLA papers to give her work, to show the date of return to work.  I put return to work date as August 11, 2011.  She has had more emotional news, her best friend has breast cancer and today she had mammogram that found a lump in her breast that will need biopsied.  Starting celexa  today.  Prescription was sent to pharmacy here instead of [place].      Comer Masse, D.O. 07/06/2011 4:12 PM  PGY-3  Department of Internal Medicine  [place]  [facility]

## 2011-07-06 NOTE — Telephone Encounter (Signed)
Called and spoke with her and she would like to speak with Dr. Stacie Glaze about her FMLA form.  She also, has a question about Celexa.  She did not start the medication yet.  Informed her that it was sent to the [facility].  Please call her back to discuss.

## 2011-07-06 NOTE — Telephone Encounter (Signed)
 See previous message.  Please call patient.

## 2011-07-06 NOTE — Telephone Encounter (Signed)
Called patient on cell and home phone numbers.  Spoke to patient but she was not busy, she is going to call me back.    Olin Pia, D.O. 07/06/2011 4:01 PM  PGY-3  Department of Internal Medicine  [facility]

## 2011-07-07 ENCOUNTER — Ambulatory Visit (HOSPITAL_COMMUNITY): Payer: Self-pay

## 2011-07-07 ENCOUNTER — Encounter (INDEPENDENT_AMBULATORY_CARE_PROVIDER_SITE_OTHER): Payer: Self-pay | Admitting: GENERAL

## 2011-07-07 NOTE — Progress Notes (Signed)
Mailed copy of fmla papers to patient per her request

## 2011-07-10 ENCOUNTER — Ambulatory Visit (INDEPENDENT_AMBULATORY_CARE_PROVIDER_SITE_OTHER): Payer: Self-pay | Admitting: Internal Medicine

## 2011-07-10 NOTE — Telephone Encounter (Signed)
Ordered u/s guided breast biopsy.

## 2011-07-10 NOTE — Telephone Encounter (Signed)
Message copied by Delsa Bern on Mon Jul 10, 2011 10:42 AM  ------       Message from: Michel Bickers       Created: Mon Jul 10, 2011  9:41 AM       Regarding: order         >> Michel Bickers 07/10/2011 09:41 AM       Christianne Dolin, MD              The breast care center needs an order for an guided ultrasound biopsy of the Right breast.

## 2011-07-11 ENCOUNTER — Other Ambulatory Visit (HOSPITAL_COMMUNITY): Payer: Self-pay | Admitting: Diagnostic Radiology

## 2011-07-11 ENCOUNTER — Ambulatory Visit
Admission: RE | Admit: 2011-07-11 | Discharge: 2011-07-11 | Disposition: A | Discharge status: Home / Self Care | Payer: 59 | Source: Ambulatory Visit | Attending: Internal Medicine | Admitting: Internal Medicine

## 2011-07-11 DIAGNOSIS — N6019 Diffuse cystic mastopathy of unspecified breast: Secondary | ICD-10-CM | POA: Insufficient documentation

## 2011-07-11 DIAGNOSIS — N63 Unspecified lump in unspecified breast: Secondary | ICD-10-CM

## 2011-07-11 MED ORDER — LIDOCAINE HCL 10 MG/ML (1 %) INJECTION SOLUTION
2.0000 mL | Freq: Once | INTRAMUSCULAR | Status: AC
Start: 2011-07-11 — End: 2011-07-11
  Administered 2011-07-11: 20 mg via INTRADERMAL
  Filled 2011-07-11: qty 2

## 2011-07-11 MED ORDER — LIDOCAINE 1 %-EPINEPHRINE 1:100,000 INJECTION SOLUTION
5.0000 mL | Freq: Once | INTRAMUSCULAR | Status: AC
Start: 2011-07-11 — End: 2011-07-11
  Administered 2011-07-11: 50 mg via INTRAMUSCULAR
  Filled 2011-07-11: qty 5

## 2011-07-11 NOTE — Discharge Instructions (Signed)
Minimally Invasive Breast Biopsy Care Instructions     You may take a shower today.Let the water run over the incision. Pat it dry. No bathing or soaking your breast in water until the glue flakes off.   If the incision comes open, place a dry band-aid on it. DO NOT apply ointments to the incision.   Check your incision frequently (every hour for the next four hours). If there would be bleeding present, apply firm pressure against the incision. If after 10 minutes of pressure, the area does not stop bleeding, go to the nearest E.R or Urgent Care Center.   Wear your bra continuously for the next 48 hours.   Keep ice on the incision until bedtime tonight. It is important that the ice pack does not touch your bare skin as it can cause frostbite.   You may have mild to moderate discomfort and a small to moderate amount of bruising. This is normal.   If you need medication for discomfort, take non-aspirin products (such as Tylenol, Motrin, Advil, or Ibuprofen), as directed by the manufacture for pain. DO NOT USE ASPIRIN for 48 hours.   Please avoid strenuous activities, such as, heavy cleaning, sweeping, vacuuming, yard work, tennis, golf, aerobics, skiing, weight lifting for 72 hours. You cannot lift anything with the affected side greater than 5 pounds for the next 72 hours.   Watch for signs of infection. Excessive bleeding, drainage, swelling, redness, heat, pain, or fever. If that should happen, please call [phone], or your physician. If it would be on a weekend please go to an Urgent Care Center or E.R.   For several days or even a couple weeks, you may have tenderness, "twinges", and a bump. This can be bothersome, but is not abnormal. Hot, moist packs using a wet washcloth heated in a microwave for 10 seconds and placed in a plastic bag may make this feel better. Do not use a heating pad. Usually this will disappear completely with a little time.   We will call you once we receive your pathology  report.  Approximate time is seven to fourteen days.   If you should have any questions or complications, please call Seydou Hearns, [facility] Nurse at [phone]

## 2011-07-11 NOTE — Procedures (Signed)
[facility]    [facility]  Breast Biopsy Pre/Post Assessment       Buczek,Bianney L  Date of Service: 07/11/2011    Family Present in Biopsy Room:no  Emotional Status:Anxious       TIME OUT DOCUMENTATION:  Correct patient: Yes  Correct patient position: Yes  Correct procedure: Yes  Correct side and site are marked: Yes  Correct equipment: Yes  Name of technologist present for time out: Henrine Screws  Name of physician present for time out: Burnard Hawthorne, MD  Name of resident present for time out: Merryl Hacker, MD  Name of nurse present for time out: Janetta Hora  Time out - Site1: 1:05        Allergies   Allergen Reactions   . Iodine-Iodine Containing Itching, Swelling and Hives/ Urticaria   . Sulfa (Sulfonamides) Nausea/ Vomiting     Patient Medications :Patient is not currently taking Aspirin, Plavix or Coumadin  Consent signed: Burnard Hawthorne, MD                          Site Marked: Burnard Hawthorne, MD   Patient Postioning Time: 1:05    PROCEDURE:  Procedure:RUBX  Procedure Start: 1:05  Dressing Applied: Dermabond  Complications: no  Procedure Completed Time:1320  Patient Tolerated Procedure:Good  Clip Manufacture: [employer] 14G                         Lot #: [id number]    POST PROCEDURE CARE:  Ace Wrap Applied:no  Ice Applied to Site:yes  Evidence of Bleeding:no  Condition at Discharge: Good  Written Discharge Instructions:yes  Follow Up With Pathology Report Breast Care Center:yes    Time of Discharge:1325    Present During Procedure:  Physician:Ginger Konrad Felix, MD  Technologist:Carla Plum  Nurse:Mylasia Vorhees  Resident: Merryl Hacker, MD    Comments/notes:     Janetta Hora, RN, 07/11/2011 1:02 PM                                        Janetta Hora, RN 07/11/2011, 1:02 PM

## 2011-07-12 ENCOUNTER — Ambulatory Visit (INDEPENDENT_AMBULATORY_CARE_PROVIDER_SITE_OTHER): Payer: Self-pay | Admitting: Internal Medicine

## 2011-07-12 LAB — HISTORICAL SURGICAL PATHOLOGY SPECIMEN

## 2011-07-12 NOTE — Telephone Encounter (Signed)
Message copied by Evelina Dun on Wed Jul 12, 2011  5:08 PM  ------       Message from: [place], [place]       Created: Wed Jul 12, 2011  3:25 PM         >> NANCY CLINE 07/12/2011 03:25 PM       Buddenberg Pt:   Pt is wanting to speak with you concerning a paper that is on deadline for tomorrow concerning her FMLA. Call either #. Thank you

## 2011-07-13 ENCOUNTER — Other Ambulatory Visit (INDEPENDENT_AMBULATORY_CARE_PROVIDER_SITE_OTHER): Payer: Self-pay | Admitting: Internal Medicine

## 2011-07-13 ENCOUNTER — Institutional Professional Consult (permissible substitution) (HOSPITAL_COMMUNITY): Payer: Self-pay

## 2011-07-19 ENCOUNTER — Ambulatory Visit (HOSPITAL_COMMUNITY): Payer: Self-pay

## 2011-08-07 ENCOUNTER — Ambulatory Visit (INDEPENDENT_AMBULATORY_CARE_PROVIDER_SITE_OTHER): Payer: Self-pay | Admitting: Internal Medicine

## 2011-08-07 NOTE — Telephone Encounter (Signed)
Message copied by Erlene Quan on Mon Aug 07, 2011  1:06 PM  ------       Message from: Ace Gins       Created: Mon Aug 07, 2011 11:09 AM         >> Ace Gins 08/07/2011 11:09 AM       DR Stacie Glaze       Patient is supposed to return to work on 08/09/2011 and has not been given the FMLA papers and return to work. She is needing to ask someone questions regarding this. She has made an appointment for 08/09/2011 at 2pm, but is supposed to return to work before then. Please call. Thank you

## 2011-08-09 ENCOUNTER — Ambulatory Visit (INDEPENDENT_AMBULATORY_CARE_PROVIDER_SITE_OTHER): Payer: Self-pay | Admitting: Internal Medicine

## 2011-08-09 ENCOUNTER — Encounter (INDEPENDENT_AMBULATORY_CARE_PROVIDER_SITE_OTHER): Payer: Self-pay | Admitting: Internal Medicine

## 2011-08-09 ENCOUNTER — Ambulatory Visit: Payer: 59 | Attending: Internal Medicine | Admitting: Internal Medicine

## 2011-08-09 VITALS — BP 118/80 | HR 83 | Temp 98.4°F | Ht 70.0 in | Wt 218.0 lb

## 2011-08-09 DIAGNOSIS — F3289 Other specified depressive episodes: Secondary | ICD-10-CM | POA: Insufficient documentation

## 2011-08-09 DIAGNOSIS — M722 Plantar fascial fibromatosis: Secondary | ICD-10-CM | POA: Insufficient documentation

## 2011-08-09 MED ORDER — ZOLPIDEM 5 MG TABLET
5.00 mg | ORAL_TABLET | Freq: Every evening | ORAL | Status: DC | PRN
Start: 2011-08-09 — End: 2013-05-23

## 2011-08-09 NOTE — Progress Notes (Signed)
Subjective:      Natasha Perez is a 40 y.o. female here for follow-up after seeing Dr. Stacie Glaze 06/28/2011 for stress/depression.      Was off work due to stress, Dr. Stacie Glaze filled out FMLA papers.      Back to work starting today, needs back to work slip to return today    Has had 2 weeks of pain in right foot, plantar fasciitis. But now up to right achilles and up into post knee.  Scanned herself to r/o dvt which was negative but some fluid around knee, no bakers cyst    Hasn't noticed difference on celexa yet, has been on it about a month.  Not sleeping well.  Afraid of ambien.  Wonders about melatonin.  Gets loopy on benadryl.      Lump in breast after friend died breast ca at age 61, had to have biopsy      Patient's last menstrual period was 07/27/2011.        Current Outpatient Prescriptions   Medication Sig   . zolpidem (AMBIEN) 5 mg Oral Tablet take 1 Tab by mouth Every night as needed for Insomnia.   . citalopram (CELEXA) 20 mg Oral Tablet take 1 Tab by mouth Once a day.   . cyclobenzaprine (FLEXERIL) 10 mg Oral Tablet take 1 Tab by mouth Three times a day.   . ergocalciferol, vitamin D2, (DRISDOL) 50,000 unit Oral Capsule take 1 Cap by mouth Q7 Days.   . multivitamin Tab take 1 Tab by mouth Once a day.             Objective:     BP 118/80   Pulse 83   Temp(Src) 36.9 C (98.4 F) (Tympanic)   Ht 1.778 m (5\' 10" )   Wt 98.884 kg (218 lb)   BMI 31.28 kg/m2   SpO2 96%   LMP 07/27/2011    Body mass index is 31.28 kg/(m^2).       General: No acute distress.  Pain in right foot arch and right heel  Palpation popliteal fossa bilat reveals some swelling in right compared to left knee    Labs:  No visits with results within 6 Month(s) from this visit.  Latest known visit with results is:    Hospital Outpatient Visit on 07/21/2010   Component Date Value   . 25 HYDROXYVITAMIN D2 07/21/2010 47.9    . 25 HYDROXYVITAMIN D3 07/21/2010 5.0    . 25 HYDROXYVITAMIN D2/D3,* 07/21/2010 52.9             Assessment/Plan:       40 year old woman is here for follow-up after being off of work per [employer] paperwork filed by Dr. Stacie Glaze.      Today is first day back to work after being off since Oct. 3 for depression/stress and she needs a return to work slip.      She has had multiple losses recently.  She had a breast lump that required biopsy which happened right after a friend died of breast cancer at age 40.  She is having stress at work.  She started celexa almost a month ago with no noticeable difference.  If no improvement in a week, will consider switching to another agent.  She was given the names of counselors today, but she thinks that traveling 2 hours to come see them in [place] will be difficult.  She hopes she can see them once then be referred to somebody closer.  She was  hesitant to take Remus Loffler because of fear of side effects but took script to have in case of an emergency. She is looking for a new job.     In addition, she has had a flare of right foot plantar fasciitis with some possible achilles tendonitis on top of it.  She will continue taking ibuprofen for that.      FMLA papers from Dr. Stacie Glaze had her off until 11/4 so today she needs paperwork from 11/5 to 11/13 which I completed.       Weight. Body mass index is 31.28 kg/(m^2).  Weight: 98.884 kg (218 lb)      Return in about 4 months (around 12/07/2011).      Christianne Dolin, MD, MPH  Associate Professor  [place] Department of Medicine  Clinic Director, [facility]

## 2011-08-09 NOTE — Telephone Encounter (Signed)
Message copied by Delsa Bern on Wed Aug 09, 2011  1:02 PM  ------       Message from: Gibson Ramp       Created: Wed Aug 09, 2011 10:03 AM         >> Gibson Ramp 08/09/2011 10:03 AM       [facility]       The pt is requesting that anyone call her concerning returning to work. She states that she has left several messages. She is requesting that someone call her at ext [phone] asap, she is on the clock and doesn't have the paper work stating that she is allowed to be. Please call her asap

## 2011-08-09 NOTE — Progress Notes (Signed)
fmla paperwk to pt and faxed to Clayborn Heron, LPN

## 2011-08-11 ENCOUNTER — Encounter (INDEPENDENT_AMBULATORY_CARE_PROVIDER_SITE_OTHER): Payer: Self-pay | Admitting: Internal Medicine

## 2011-08-11 NOTE — Progress Notes (Signed)
Faxed fmla form to [employer] patient to go back t work 11.14.2012 per dr Autumn Messing

## 2011-08-15 ENCOUNTER — Ambulatory Visit (INDEPENDENT_AMBULATORY_CARE_PROVIDER_SITE_OTHER): Payer: 59 | Admitting: GENERAL

## 2011-08-16 ENCOUNTER — Other Ambulatory Visit (INDEPENDENT_AMBULATORY_CARE_PROVIDER_SITE_OTHER): Payer: Self-pay | Admitting: Internal Medicine

## 2011-08-16 MED ORDER — ERGOCALCIFEROL (VITAMIN D2) 1,250 MCG (50,000 UNIT) CAPSULE
50000.0000 [IU] | ORAL_CAPSULE | ORAL | Status: DC
Start: 2011-08-16 — End: 2012-08-20

## 2011-08-29 ENCOUNTER — Encounter (INDEPENDENT_AMBULATORY_CARE_PROVIDER_SITE_OTHER): Payer: 59 | Admitting: GENERAL

## 2011-08-30 ENCOUNTER — Encounter (INDEPENDENT_AMBULATORY_CARE_PROVIDER_SITE_OTHER): Payer: Self-pay | Admitting: Internal Medicine

## 2011-08-30 ENCOUNTER — Ambulatory Visit: Payer: 59 | Attending: Internal Medicine | Admitting: Internal Medicine

## 2011-08-30 VITALS — BP 120/70 | HR 76 | Temp 98.5°F | Ht 70.5 in | Wt 215.0 lb

## 2011-08-30 MED ORDER — MELOXICAM 15 MG TABLET
15.0000 mg | ORAL_TABLET | Freq: Every day | ORAL | Status: DC
Start: 2011-08-30 — End: 2013-05-23

## 2011-08-30 MED ORDER — CYCLOBENZAPRINE 10 MG TABLET
10.0000 mg | ORAL_TABLET | Freq: Three times a day (TID) | ORAL | Status: DC
Start: 2011-08-30 — End: 2012-01-16

## 2011-08-30 MED ORDER — AMITRIPTYLINE 25 MG TABLET
25.00 mg | ORAL_TABLET | Freq: Every evening | ORAL | Status: DC
Start: 2011-08-30 — End: 2013-05-23

## 2011-08-30 NOTE — Progress Notes (Signed)
Subjective:      Natasha Perez is a 40 y.o. female here for follow-up.    May be staying in [facility] through week to help reduce stress at work.  Hasn't talked to anyone like a therapist yet but may be able to if staying in [facility].     Right knee still bothering her, has scanned it a couple more times to rule out blood clot and cysts.     Ibuprofen q3 with continued pain    Worse when sitting for a while then gets up    Right foot hurting as well    Still with cough, buddenberg gave z-pack, comes back every 3-4 weeks, some wheezing at times.  Wonders if she is having an allergy to something but is worried bc hasn't had a CXR to f/u her melanoma.     Patient's last menstrual period was 08/23/2011.        Current Outpatient Prescriptions   Medication Sig   . Meloxicam (MOBIC) 15 mg Oral Tablet take 1 Tab by mouth Once a day.   Marland Kitchen amitriptyline (ELAVIL) 25 mg Oral Tablet take 1 Tab by mouth every night.   . cyclobenzaprine (FLEXERIL) 10 mg Oral Tablet take 1 Tab by mouth Three times a day.   . ergocalciferol, vitamin D2, (DRISDOL) 50,000 unit Oral Capsule take 1 Cap by mouth Q7 Days.   Marland Kitchen zolpidem (AMBIEN) 5 mg Oral Tablet take 1 Tab by mouth Every night as needed for Insomnia.   Marland Kitchen DISCONTD: citalopram (CELEXA) 20 mg Oral Tablet take 1 Tab by mouth Once a day.   Marland Kitchen DISCONTD: cyclobenzaprine (FLEXERIL) 10 mg Oral Tablet take 1 Tab by mouth Three times a day.   . multivitamin Tab take 1 Tab by mouth Once a day.             Objective:     BP 120/70   Pulse 76   Temp(Src) 36.9 C (98.5 F) (Tympanic)   Ht 1.791 m (5' 10.5")   Wt 97.523 kg (215 lb)   BMI 30.41 kg/m2   LMP 08/23/2011    Body mass index is 30.41 kg/(m^2).       General: No acute distress.  Heart: Regular rate and rhythm without murmur.  Lungs: Clear to auscultation bilaterally.  Extremities: right knee tender in the posterior fossa with a prominence, cyst vs muscle spasm    Labs:  No visits with results within 6 Month(s) from this visit.   Latest known visit with results is:    Hospital Outpatient Visit on 07/21/2010   Component Date Value   . 25 HYDROXYVITAMIN D2 07/21/2010 47.9    . 25 HYDROXYVITAMIN D3 07/21/2010 5.0    . 25 HYDROXYVITAMIN D2/D3,* 07/21/2010 52.9            Assessment/Plan:     40 year old with history of melanoma is here for follow-up:   Knee pain and right plantar fasciitis and achilles tendonitis.  Try Mobic instead of ibuprofen and refer to dr lively.  Will try flexeril too.   Depression/stress.  Was off work from oct 3 until my last appointment with her.  She thinks she has a better handle on things now.  Things are a bit better at work.  She doesn't feel the celexa did anything and is interested in coming off of it.  Still with a lot of trouble sleeping so try elavil 25mg  qhs.  (see instructions)  Back pain at times. Occasionally needs flexeril.  Vitamin D deficiency. Taking 50,000 units weekly.  Repeated level here last year and it was good but she feels comfortable staying on the vitamin D.   Menstrual cramps and history of fibroid. S/p surg for fibroid. Takes ibuprofen prn cramping and still sees her old ob/gyn in [place].  Weight. Obese.  Was previously maintaining around 180 pounds. Body mass index is 30.41 kg/(m^2).  Weight: 97.523 kg (215 lb)  Contraception. Not on any birth control and would love to get pregnant. Advised to make sure she gets adequate folic acid just in case.   Prevention.  Last pap. Does with gyn in [place]. Gets every year. Had to repeat one once for abnormality and just had it May 2011.   Last mammogram. Had to have biopsy and ordered the recommended repeat right unilateral mammogram for 6 months from oct.    Colon cancer screening. N/A.   Osteoporosis prevention.      Last BMD: No BMD.    Weight bearing exercise? none    Last Vitamin D level: 06/2010    Amount Calcium from diet: Calcium encouraged. Drinks a lot of milk.     Amount Calcium from supplement: none     Amount Vitamin D from supplement: 50,000 units weekly    Lipid screening. Had it done and lipids were never an issue.   Immunizations.   Flu. Got fall 2012 through work.   Pneumovax. N/A.   Tetanus. Is current. Doesn't know date though but says that employee health made her get adacel too. 2011.   Shingles. N/A.         F/u 3 months      Orders Placed This Encounter   . XR CHEST PA AND LATERAL   . MAMMO UNILATERAL DIAGNOSTIC RIGHT   . Referral to Rheumatology   . Meloxicam (MOBIC) 15 mg Oral Tablet   . amitriptyline (ELAVIL) 25 mg Oral Tablet   . cyclobenzaprine (FLEXERIL) 10 mg Oral Tablet       Patient Instructions   Please cut your celexa 20mg  tabs in half and take 10mg  daily for 1 week, then take 10mg  every other day for about another week. Then stop the celexa.    Start the amitriptyline immediately (take at night), but please cut it in half and only take half a tab for the first week.  It can make you sleepy and may cause a dry mouth.            Christianne Dolin, MD, MPH  Associate Professor  [place] Department of Medicine  Clinic Director, [facility]

## 2011-08-30 NOTE — Patient Instructions (Signed)
 Please cut your celexa  20mg  tabs in half and take 10mg  daily for 1 week, then take 10mg  every other day for about another week. Then stop the celexa .    Start the amitriptyline  immediately (take at night), but please cut it in half and only take half a tab for the first week.  It can make you sleepy and may cause a dry mouth.

## 2011-09-07 ENCOUNTER — Other Ambulatory Visit (INDEPENDENT_AMBULATORY_CARE_PROVIDER_SITE_OTHER): Payer: Self-pay | Admitting: INTERNAL MEDICINE

## 2011-09-13 ENCOUNTER — Encounter (INDEPENDENT_AMBULATORY_CARE_PROVIDER_SITE_OTHER): Payer: Self-pay | Admitting: Internal Medicine

## 2011-10-02 ENCOUNTER — Ambulatory Visit: Payer: 59 | Attending: PREVENTIVE MEDICINE-OCCUPATIONAL MEDICINE

## 2011-10-02 DIAGNOSIS — Z0000 Encounter for general adult medical examination without abnormal findings: Secondary | ICD-10-CM | POA: Insufficient documentation

## 2011-10-02 LAB — LIPID PANEL
CHOLESTEROL: 151 mg/dL (ref <200)
HDL-CHOLESTEROL: 58 mg/dL (ref >39)
LDL (CALCULATED): 80 mg/dL (ref <100)
NON - HDL (CALCULATED): 93 mg/dL (ref <190)
TRIGLYCERIDES: 65 mg/dL (ref <150)
VLDL (CALCULATED): 13 mg/dL (ref <30)

## 2011-10-02 LAB — GLUCOSE FASTING: GLUCOSE, FASTING: 90 mg/dL (ref 70–105)

## 2011-10-03 ENCOUNTER — Other Ambulatory Visit (INDEPENDENT_AMBULATORY_CARE_PROVIDER_SITE_OTHER): Payer: 59 | Admitting: INTERNAL MEDICINE

## 2011-10-09 ENCOUNTER — Ambulatory Visit
Admission: RE | Admit: 2011-10-09 | Discharge: 2011-10-09 | Disposition: A | Discharge status: Home / Self Care | Payer: 59 | Source: Ambulatory Visit | Attending: INTERNAL MEDICINE | Admitting: INTERNAL MEDICINE

## 2011-10-09 ENCOUNTER — Ambulatory Visit (HOSPITAL_BASED_OUTPATIENT_CLINIC_OR_DEPARTMENT_OTHER): Payer: 59 | Admitting: INTERNAL MEDICINE

## 2011-10-09 ENCOUNTER — Encounter (INDEPENDENT_AMBULATORY_CARE_PROVIDER_SITE_OTHER): Payer: Self-pay | Admitting: INTERNAL MEDICINE

## 2011-10-09 DIAGNOSIS — M25569 Pain in unspecified knee: Secondary | ICD-10-CM

## 2011-10-09 DIAGNOSIS — M6580 Other synovitis and tenosynovitis, unspecified site: Secondary | ICD-10-CM | POA: Insufficient documentation

## 2011-10-11 NOTE — Letter (Signed)
[facility]                              DEPARTMENT OF MEDICINE                                [place], [place] [address]                                PATIENT NAME: Natasha, Perez  HOSPITAL UEAVWU:[id number]  DATE OF SERVICE:10/09/2011  DATE OF BIRTH: 07/10/1971    October 10, 2010    Natasha Dolin MD   [facility]   [address]   [place] [address]   [place], [place]  [address]    Dear Natasha Perez:    I saw your patient, Natasha Perez, in clinic.  This 41 year old female has complained of some right knee pain for the past 3 months.  The patient denies any known initiating trauma and says the pain began insidiously.  She complains of pain primarily in the posterior medial portion of the right knee that is worse with ambulation.  She has some pain with weightbearing and with first taking steps after starting ambulation.  She denies any significant swelling of the knee and has no locking or giving out of the joint.  She has tried taking Mobic without any improvement in the symptoms.  She was concerned about a possible blood clot or Baker cyst and had several ultrasounds without any evidence of either condition.  She denies any past history of pain into that knee.  These symptoms have persisted without any improvement.  She has not had any physical therapy or any braces for the problem.  She denies any other significant joint pains at this time.    Her past medical history is negative for any significant medical problems.  She does have a past history of a melanoma removed from her right thigh.    ALLERGIES:  Sulfa and iodine.    CURRENT MEDICATIONS:    1.  Vitamin D.  2.  Mobic.    She denies any alcohol or tobacco use.  She works as an Print production planner.  There is no family history of significant arthritis in the family.    PHYSICAL EXAMINATION:  Height 5 feet 10-1/2 inches, weight is 215 pounds, blood pressure is 120/84.  In general, she is no acute distress.  On  musculoskeletal exam, she has full active range of motion of her right knee with some mild pain noted in full flexion.  There is no synovitis noted of the joint.  She has no tenderness to palpation over the medial patellar facet.  Her knee is stable to valgus and varus stress without pain.  She has negative Lachman test.  McMurray testing produced a patellofemoral crepitus in the knee only.  There is tenderness to palpation over the medial hamstring tendon and increased pain in that area with resisted knee flexion.  There is no synovitis of her upper or lower extremity joints.  Neurologic:  DTRs are +2 in her lower extremities.    Natasha Perez has some evidence of medial hamstrings tendinitis evident on physical exam.  I did discuss this diagnosis with her and recommended either a physical therapy program or a home exercise program to improve the strength and  flexibility of the hamstring.  She opted for home exercises and she was given a program to be done on a daily basis.  She can continue to take the Mobic as needed.  If there is no improvement after a month of the home exercises, then she can return for another evaluation.    Thank you for allowing me to participate in the care of your patient.  If you have any questions, please feel free to contact me.    Sincerely,      Natasha Primer, DO  Professor  Section of Rheumatology  [place] [facility]    [id number]; D: 10/11/2011 10:37:17; T: 10/11/2011 21:09:30    cc: Natasha Pitcher MD      Natasha Perez

## 2011-10-11 NOTE — H&P (Signed)
See dictated note.

## 2012-01-01 ENCOUNTER — Encounter (INDEPENDENT_AMBULATORY_CARE_PROVIDER_SITE_OTHER): Payer: Self-pay | Admitting: Internal Medicine

## 2012-01-01 ENCOUNTER — Encounter (INDEPENDENT_AMBULATORY_CARE_PROVIDER_SITE_OTHER): Payer: 59 | Admitting: Internal Medicine

## 2012-01-03 ENCOUNTER — Other Ambulatory Visit (INDEPENDENT_AMBULATORY_CARE_PROVIDER_SITE_OTHER): Payer: Self-pay

## 2012-01-03 ENCOUNTER — Encounter (INDEPENDENT_AMBULATORY_CARE_PROVIDER_SITE_OTHER): Payer: Self-pay | Admitting: Internal Medicine

## 2012-01-03 NOTE — Telephone Encounter (Signed)
Message copied by Raford Pitcher on Wed Jan 03, 2012  2:23 PM  ------       Message from: [place], [place] MARIE       Created: Wed Jan 03, 2012  1:17 PM       Regarding: FW: Non-Urgent Medical Question       Contact: [phone]                       ----- Message -----          From: Natasha Perez          Sent: 01/03/2012  10:42 AM            To: [facility] Nurses       Subject: Non-Urgent Medical Question                                My reason for concern is as follows:       Rt breast had area of interest with biopsy and recommendation for f/u(understand insurance will pay for this)              New concern(which I forget to include in my email...sorry):       Lt Breast has a crusty nipple and sore with discharge for the last few weeks....plus my h/o previous cancer....Marland Kitchenleads me to think a bilateral would be warranted.              Please advise?????

## 2012-01-10 ENCOUNTER — Encounter (INDEPENDENT_AMBULATORY_CARE_PROVIDER_SITE_OTHER): Payer: 59 | Admitting: Internal Medicine

## 2012-01-11 ENCOUNTER — Ambulatory Visit
Admission: RE | Admit: 2012-01-11 | Discharge: 2012-01-11 | Disposition: A | Discharge status: Home / Self Care | Payer: 59 | Source: Ambulatory Visit | Attending: Internal Medicine | Admitting: Internal Medicine

## 2012-01-11 ENCOUNTER — Ambulatory Visit (HOSPITAL_BASED_OUTPATIENT_CLINIC_OR_DEPARTMENT_OTHER)
Admission: RE | Admit: 2012-01-11 | Discharge: 2012-01-11 | Disposition: A | Discharge status: Home / Self Care | Payer: 59 | Source: Ambulatory Visit | Attending: Internal Medicine | Admitting: Internal Medicine

## 2012-01-11 ENCOUNTER — Other Ambulatory Visit (INDEPENDENT_AMBULATORY_CARE_PROVIDER_SITE_OTHER): Payer: Self-pay | Admitting: Internal Medicine

## 2012-01-11 DIAGNOSIS — N6459 Other signs and symptoms in breast: Secondary | ICD-10-CM | POA: Insufficient documentation

## 2012-01-11 DIAGNOSIS — Z9189 Other specified personal risk factors, not elsewhere classified: Secondary | ICD-10-CM | POA: Insufficient documentation

## 2012-01-11 DIAGNOSIS — N6489 Other specified disorders of breast: Secondary | ICD-10-CM

## 2012-01-16 ENCOUNTER — Other Ambulatory Visit (INDEPENDENT_AMBULATORY_CARE_PROVIDER_SITE_OTHER): Payer: Self-pay | Admitting: Internal Medicine

## 2012-01-16 ENCOUNTER — Encounter (INDEPENDENT_AMBULATORY_CARE_PROVIDER_SITE_OTHER): Payer: Self-pay | Admitting: Internal Medicine

## 2012-01-16 NOTE — Telephone Encounter (Signed)
From: Quentin Angst  To: Christianne Dolin, MD  Sent: 01/16/2012 3:31 PM EDT  Subject: Medication Renewal Request    Original authorizing provider: Christianne Dolin, MD    Quentin Angst would like a refill of the following medications:  cyclobenzaprine (FLEXERIL) 10 mg Oral Tablet Christianne Dolin, MD]    Preferred pharmacy: [facility] - [place], [place] - [address]    Comment:

## 2012-01-17 ENCOUNTER — Telehealth (INDEPENDENT_AMBULATORY_CARE_PROVIDER_SITE_OTHER): Payer: Self-pay | Admitting: Internal Medicine

## 2012-01-17 MED ORDER — DICLOFENAC 3 % TOPICAL GEL
1.00 [IU] | Freq: Two times a day (BID) | CUTANEOUS | Status: DC
Start: 2012-01-17 — End: 2012-01-17

## 2012-01-17 MED ORDER — CYCLOBENZAPRINE 10 MG TABLET
10.00 mg | ORAL_TABLET | Freq: Three times a day (TID) | ORAL | Status: DC
Start: 2012-01-16 — End: 2016-04-21

## 2012-01-17 MED ORDER — DICLOFENAC EPOLAMINE 1.3 % TRANSDERMAL 12 HOUR PATCH
180.00 mg | MEDICATED_PATCH | Freq: Two times a day (BID) | TRANSDERMAL | Status: DC
Start: 2012-01-17 — End: 2019-07-17

## 2012-01-17 NOTE — Telephone Encounter (Signed)
[facility]ill for Skelaxin 800 mg.  TID.  This is not on the med list.

## 2012-01-17 NOTE — Telephone Encounter (Signed)
Message copied by Raford Pitcher on Wed Jan 17, 2012 12:04 PM  ------       Message from: Carola Frost       Created: Tue Jan 16, 2012  3:45 PM       Regarding: FW: Non-Urgent Medical Question       Contact: [phone]                       ----- Message -----          From: Quentin Angst          Sent: 01/16/2012   3:31 PM            To: [facility] Nurses       Subject: Non-Urgent Medical Question                                Hey Dr Autumn Messing,              Josefa Half having a flair up of my neck/shoulder pain today. It was really bad this morning I could barely come to work. Could you call me in a prescription for my skelaxin 800mg .              Also, a friend gave me a topical patch this morning which really aided in my mobility. Can you give me a prescription for [facility]- diclofenac epolamine topical patch 3%. ( seemed to allow me to work)              I am on call this week, cannot afford to get down in my back..cannot afford to be off....please let me know if there is a problem with my requests.       Thanks so much....make it a good day!              Clarisse Gouge :)

## 2012-04-30 ENCOUNTER — Ambulatory Visit (INDEPENDENT_AMBULATORY_CARE_PROVIDER_SITE_OTHER): Payer: Self-pay | Admitting: Internal Medicine

## 2012-04-30 NOTE — Telephone Encounter (Signed)
 Message copied by CECIL-CRIHFIELD, EPPIE ANDREWS on Tue Apr 30, 2012  3:22 PM  ------       Message from: SMITTY MOCCASIN       Created: Tue Apr 30, 2012  2:38 PM         >> MOCCASIN SMITTY, [place] 04/30/2012 02:38 PM       Put form in your box for patient to apply to PA school .  Wanted to let you know that the only medication she is on is motrin  prn,fleriril as needed vitamin d  and multi-vitamin please call her on her cell at [phone]

## 2012-05-09 NOTE — Telephone Encounter (Signed)
Notified patient form is done.  Cray Monnin Genella Mech, RN 05/09/2012, 3:47 PM

## 2012-05-09 NOTE — Telephone Encounter (Signed)
 Completed form.  Will route to nurses to send to patient.  Placed form in my folder.

## 2012-05-17 ENCOUNTER — Other Ambulatory Visit (INDEPENDENT_AMBULATORY_CARE_PROVIDER_SITE_OTHER): Payer: Self-pay | Admitting: Internal Medicine

## 2012-05-17 MED ORDER — IBUPROFEN 600 MG TABLET
600.00 mg | ORAL_TABLET | Freq: Four times a day (QID) | ORAL | Status: DC | PRN
Start: 2012-05-17 — End: 2014-02-11

## 2012-05-17 NOTE — Telephone Encounter (Signed)
Received fax from pharmacy requesting refill for motrin 800mg  PO one tab every 6 hours prn for pain with quantity of 90.  Not found on medication list.  Please advise.  Simon Rhein Sigmond Patalano, RN 05/17/2012, 9:13 AM

## 2012-08-20 ENCOUNTER — Other Ambulatory Visit (INDEPENDENT_AMBULATORY_CARE_PROVIDER_SITE_OTHER): Payer: Self-pay | Admitting: Internal Medicine

## 2012-08-20 MED ORDER — ERGOCALCIFEROL (VITAMIN D2) 1,250 MCG (50,000 UNIT) CAPSULE
50000.0000 [IU] | ORAL_CAPSULE | ORAL | Status: DC
Start: 2012-08-20 — End: 2013-05-23

## 2012-10-03 ENCOUNTER — Encounter (INDEPENDENT_AMBULATORY_CARE_PROVIDER_SITE_OTHER): Payer: Self-pay | Admitting: Pediatrics

## 2012-10-08 ENCOUNTER — Encounter (INDEPENDENT_AMBULATORY_CARE_PROVIDER_SITE_OTHER): Payer: Self-pay | Admitting: Internal Medicine

## 2013-01-24 ENCOUNTER — Encounter (INDEPENDENT_AMBULATORY_CARE_PROVIDER_SITE_OTHER): Payer: Self-pay | Admitting: Internal Medicine

## 2013-01-24 MED ORDER — ONDANSETRON 4 MG DISINTEGRATING TABLET
4.00 mg | ORAL_TABLET | Freq: Three times a day (TID) | ORAL | Status: DC | PRN
Start: 2013-01-24 — End: 2014-03-17

## 2013-01-24 MED ORDER — SCOPOLAMINE 1 MG OVER 3 DAYS TRANSDERMAL PATCH
1.50 mg | MEDICATED_PATCH | TRANSDERMAL | Status: DC
Start: 2013-01-24 — End: 2019-03-04

## 2013-01-24 NOTE — Telephone Encounter (Signed)
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  I have been traveling back and forth to [place] for training.       Can you give me a prescription for Zofran to help with my nausea and maybe also a scapalmine patch.  Any questions please call my cell [phone]              I fly out again this coming monday morning....thanks so much :)       Clarisse Gouge  ------

## 2013-05-12 ENCOUNTER — Encounter (INDEPENDENT_AMBULATORY_CARE_PROVIDER_SITE_OTHER): Payer: Self-pay | Admitting: Internal Medicine

## 2013-05-12 NOTE — Telephone Encounter (Signed)
 Message copied by Stella Encarnacion M on Mon May 12, 2013  4:28 PM  ------       Message from: DORISE ELIZABETH       Created: Mon May 12, 2013  3:27 PM       Regarding: FW: Referral Question       Contact: [phone]                       ----- Message -----          From: Dawna LITTIE Alas          Sent: 05/12/2013   3:01 PM            To: [facility] Nurses       Subject: Referral Question                                          I am past due for my mammo....last one was 12-2011              I am requesting an order for  bilateral mammo so I could possible schedule it this week while I am in town.         Any questions please feel free to call.              Thanks so much       [place] :)  ------

## 2013-05-13 ENCOUNTER — Telehealth (INDEPENDENT_AMBULATORY_CARE_PROVIDER_SITE_OTHER): Payer: Self-pay | Admitting: Internal Medicine

## 2013-05-13 NOTE — Telephone Encounter (Signed)
Called patient. She will take the 8/29 at 11:40 appointment. Informed front desk to have her scheduled. Marciana Uplinger Jannifer Hick, RN 05/13/2013, 3:11 PM

## 2013-05-13 NOTE — Telephone Encounter (Signed)
I could probably squeeze her in 8/28 or 8/29 at 11:40 so you can offer her those options.

## 2013-05-14 ENCOUNTER — Ambulatory Visit (HOSPITAL_COMMUNITY): Payer: Self-pay

## 2013-05-16 ENCOUNTER — Ambulatory Visit
Admission: RE | Admit: 2013-05-16 | Discharge: 2013-05-16 | Disposition: A | Discharge status: Home / Self Care | Payer: 59 | Source: Ambulatory Visit | Attending: Internal Medicine | Admitting: Internal Medicine

## 2013-05-16 DIAGNOSIS — Z9189 Other specified personal risk factors, not elsewhere classified: Secondary | ICD-10-CM | POA: Insufficient documentation

## 2013-05-16 DIAGNOSIS — Z1231 Encounter for screening mammogram for malignant neoplasm of breast: Secondary | ICD-10-CM | POA: Insufficient documentation

## 2013-05-23 ENCOUNTER — Ambulatory Visit: Payer: 59 | Attending: Internal Medicine | Admitting: Internal Medicine

## 2013-05-23 ENCOUNTER — Encounter (INDEPENDENT_AMBULATORY_CARE_PROVIDER_SITE_OTHER): Payer: Self-pay | Admitting: Internal Medicine

## 2013-05-23 VITALS — BP 110/78 | HR 89 | Temp 98.9°F | Ht 70.5 in | Wt 226.9 lb

## 2013-05-23 DIAGNOSIS — E559 Vitamin D deficiency, unspecified: Secondary | ICD-10-CM | POA: Insufficient documentation

## 2013-05-23 DIAGNOSIS — Z85820 Personal history of malignant melanoma of skin: Secondary | ICD-10-CM | POA: Insufficient documentation

## 2013-05-23 DIAGNOSIS — Z0000 Encounter for general adult medical examination without abnormal findings: Secondary | ICD-10-CM | POA: Insufficient documentation

## 2013-05-23 DIAGNOSIS — E669 Obesity, unspecified: Secondary | ICD-10-CM | POA: Insufficient documentation

## 2013-05-23 DIAGNOSIS — M549 Dorsalgia, unspecified: Secondary | ICD-10-CM | POA: Insufficient documentation

## 2013-05-23 MED ORDER — ERGOCALCIFEROL (VITAMIN D2) 1,250 MCG (50,000 UNIT) CAPSULE
50000.0000 [IU] | ORAL_CAPSULE | ORAL | Status: DC
Start: 2013-05-23 — End: 2014-07-14

## 2013-05-23 NOTE — Patient Instructions (Signed)
Please stop lisinopril and monitor Blood pressures.  If >140/90 please contact me.

## 2013-05-23 NOTE — Progress Notes (Signed)
 Natasha Perez is a 42 y.o. female here for annual exam.      Chief Complaint:   Chief Complaint   Patient presents with   . Physical       HPI:  Patient is here for annual exam as required by insurance.  Feeling well and has no complaints.    No longer has depression and says that was just related to her job.  She has a new job working with IT and [employer] and is much happier.  [place]    Is trying to conceive right now with her boyfriend.  She is very happy with the prospect of this.    She had somehow gotten an ambulatory BP monitor done.  At some point after that she was started on an ACE inhibitor.  She is still taking ace inhibitor but doesn't think she has high blood pressure.  Thinks it was high due to stress.       Patient's last menstrual period was 05/11/2013.    PMH:  Past Medical History   Diagnosis Date   . Melanoma 1999     right thigh, had 2 or 3 surgeries at [facility], sampled lymph nodes which were ok, still sees dermatologist yearly in [facility]       PSH:  Past Surgical History   Procedure Laterality Date   . Hx other       3 right leg surgeries for melanoma   . Hx myomectomy  2005   . Hx refractive surgery         Medications:  Current Outpatient Prescriptions   Medication Sig   . cyclobenzaprine  (FLEXERIL ) 10 mg Oral Tablet Take 1 Tab (10 mg total) by mouth Three times a day   . Diclofenac  Epolamine 1.3 % Transdermal Patch 12 hr 180 mg by Transdermal route Twice daily Please disregard prescription for diclofenac  gel, use this instead   . ergocalciferol , vitamin D2, (DRISDOL ) 50,000 unit Oral Capsule Take 1 Cap (50,000 Int'l Units total) by mouth Every 7 days   . Ibuprofen  (MOTRIN ) 600 mg Oral Tablet Take 1 Tab (600 mg total) by mouth Four times a day as needed for Pain   . multivitamin Tab take 1 Tab by mouth Once a day.   . ondansetron  (ZOFRAN  ODT) 4 mg Oral Tablet, Rapid Dissolve 1 Tab (4 mg total) by Sublingual route Every 8 hours as needed for nausea/vomiting   . scopolamine   (TRANSDERM SCOP ) 1.5 mg Transdermal Patch 72 hr 1 Patch (1.5 mg total) by Transdermal route Every 72 hours       Allergies:  Allergies   Allergen Reactions   . Iodine-Iodine Containing Itching, Swelling and Hives/ Urticaria   . Sulfa  (Sulfonamides) Nausea/ Vomiting       OB History    Grav Para Term Preterm Abortions TAB SAB Ect Mult Living    0 0                  Social history:   History     Social History   . Marital Status: Single     Spouse Name: N/A     Number of Children: 0   . Years of Education: N/A     Occupational History   . 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     Social History Main Topics   . Smoking status: Never Smoker    . Smokeless tobacco: Not on file   . Alcohol Use: No   . Drug  Use: Not on file   . Sexually Active: Yes -- Female partner(s)     Other Topics Concern   . Exercise Concern No     Tries to walk or bike.  Not getting as much exercise as she thinks she should.   SABRA Special Diet No     Only eating one meal a day now. Dinner is hit or miss.  Needs to eat more regularly she says.      Social History Narrative    Lives in [place].  No children. Has a boyfriend and recently stopped using protection. 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 Deliah is scuba diving.  Was an only child.  Got undergraduate degree at [employer] and a math degree at [employer].        Family history:   Family History   Problem Relation Age of Onset   . Hypertension Mother    . Diabetes Mother      also in mother's grandmother   . Stroke Mother 51     died of brainstem stroke   . Lung Cancer Maternal Grandmother        Review of systems:  Const: No constitutional symptoms of fevers, chills, or night sweats.  Eyes: No changes in visual acuity or diplopia.  ENT: No ear pain or sinus problems.  +dry nostrils  Skin: No rashes or changes in skin texture or changing moles.  Sees derm in [place]  to f/u for melanoma  GI: No abdominal pain, nausea or vomiting, black or bloody stools.  GU: No dysuria  or hematuria.  No problems with menstrual periods/menopausal symptoms.  +reg periods  Heme: No history of anemia or bleeding disorder.   Lungs: No dyspnea or wheezing. +cough  Heart: No chest pain, palpitations, or history of heart murmur.  Musculoskeletal: No joint pain or swelling.   Neuro: No history of stroke, frequent headaches, or symptoms of neurological impairment.   Endo: No polyuria, polydipsia, heat, or cold intolerance.    Psych: No current depression symptoms.    Physical Examination:    BP 110/78  Pulse 89  Temp(Src) 37.2 C (98.9 F) (Tympanic)  Ht 1.791 m (5' 10.5)  Wt 102.9 kg (226 lb 13.7 oz)  BMI 32.08 kg/m2  SpO2 98%  LMP 05/11/2013    Body mass index is 32.08 kg/(m^2).    General appearance  Alert, cooperative, no distress.   Head  Normocephalic, without obvious abnormality, atraumatic.   Nose Nares normal. Septum midline. Mucosa normal.    Throat Lips, mucosa, and tongue normal. Teeth and gums normal   Lungs   Clear to auscultation bilaterally.   Heart  Regular rate and rhythm, S1, S2 normal, no murmur, rub or gallop.   Abdomen   Soft, non-tender. Bowel sounds normal. No masses,  No organomegaly.   Extremities Extremities normal, atraumatic, no cyanosis or edema.   Pulses 2+ and symmetric   Skin Skin color, texture, turgor normal. No rashes or lesions.   Neurologic Cranial nerves II-XII intact grossly.       Labs:  No visits with results within 6 Month(s) from this visit.  Latest known visit with results is:    Office Visit on 10/02/2011   Component Date Value   . GLUCOSE, FASTING 10/02/2011 90    . TRIGLYCERIDES 10/02/2011 65    . CHOLESTEROL 10/02/2011 151    . HDL-CHOLESTEROL 10/02/2011 58    . LDL (CALCULATED) 10/02/2011 80    .  VLDL (CALCULATED) 10/02/2011 13    . NON - HDL (CALCULATED) 10/02/2011 93            Assessment/plan:      42 year old with history of melanoma is here for follow-up:     Back pain at times. Occasionally needs flexeril .   Vitamin D  deficiency. Taking 50,000  units weekly. Repeat level here was good but she feels comfortable staying on the vitamin D .   HTN? She will stop ACE inhibitor and monitor BP since she is trying to get pregnant and BP is very good.  She thinks high BP was situational.   Weight. Obese. Weight up 11 pounds. Body mass index is 32.08 kg/(m^2).  Weight: 102.9 kg (226 lb 13.7 oz) Was previously maintaining around 180 pounds.   Contraception. Not on any birth control and would love to get pregnant. She is taking prenatal daily.  Will stop ACE inhibitor.     Prevention.  Last pap. Does with gyn in [place] . Gets every year. Had to repeat one once for abnormality and just had it May 2011.   Last mammogram. 04/2013.  Previously had to have biopsy.   Colon cancer screening. N/A.   Osteoporosis prevention. Last BMD: No BMD. Amount Vitamin D  from supplement: 50,000 units weekly   Lipid screening. Had it done and lipids were never an issue.  Reordered as screening.   Immunizations.   Flu. N/A.  Pneumovax. N/A.   Tetanus. Is current. Thinks employee health made her get adacel 2011.   Shingles. N/A.         Inactive/resolved problems:  Knee pain and right plantar fasciitis and achilles tendonitis. Try Mobic  instead of ibuprofen  and refer to dr lively. Will try flexeril  too.   Depression/stress. Was off work from oct 3 until my last appointment with her. She thinks she has a better handle on things now. Things are a bit better at work. She doesn't feel the celexa  did anything and is interested in coming off of it. Still with a lot of trouble sleeping so try elavil  25mg  qhs. (see instructions)   Menstrual cramps and history of fibroid. S/p surg for fibroid. Takes ibuprofen  prn cramping and still sees her old ob/gyn in [place]         Orders Placed This Encounter   . LIPID PANEL   . GLUCOSE FASTING   . ergocalciferol , vitamin D2, (DRISDOL ) 50,000 unit Oral Capsule       Patient Instructions   Please stop lisinopril  and monitor Blood pressures.  If >140/90 please contact  me.          Return in about 1 year (around 05/23/2014).        Leita Auer, MD, MPH  Associate Professor  [facility] [place] Department of Medicine  Clinic Director, [facility]

## 2013-06-04 ENCOUNTER — Encounter (INDEPENDENT_AMBULATORY_CARE_PROVIDER_SITE_OTHER): Payer: Self-pay | Admitting: Internal Medicine

## 2013-06-04 ENCOUNTER — Telehealth (INDEPENDENT_AMBULATORY_CARE_PROVIDER_SITE_OTHER): Payer: Self-pay | Admitting: Internal Medicine

## 2013-06-04 NOTE — Telephone Encounter (Signed)
Message copied by Sharman Crate on Wed Jun 04, 2013  2:46 PM  ------       Message from: Luanne Bras       Created: Wed Jun 04, 2013 11:42 AM       Regarding: Prescription Question       Contact: [phone]         My OB from [place] is not available this week, I usually get my Valtrex 500mg  twice a day perscription from her....my current perscription has 2 refills remaining, but is expired! :(              I am getting a couple fever blisters, could you call this in for me since I cant get a hold of her please.  Thanks Clarisse Gouge  ------

## 2013-06-05 MED ORDER — VALACYCLOVIR 1 GRAM TABLET
2000.00 mg | ORAL_TABLET | Freq: Two times a day (BID) | ORAL | Status: DC
Start: 2013-06-05 — End: 2013-06-27

## 2013-06-06 ENCOUNTER — Encounter (INDEPENDENT_AMBULATORY_CARE_PROVIDER_SITE_OTHER): Payer: Self-pay | Admitting: Internal Medicine

## 2013-06-27 MED ORDER — VALACYCLOVIR 500 MG TABLET
500.00 mg | ORAL_TABLET | Freq: Two times a day (BID) | ORAL | Status: DC
Start: 2013-06-27 — End: 2013-08-01

## 2013-06-27 NOTE — Telephone Encounter (Signed)
Message copied by Raford Pitcher on Fri Jun 27, 2013  1:17 PM  ------       Message from: Luanne Bras       Created: Fri Jun 27, 2013 12:16 PM       Regarding: [id number]       Contact: [phone]         HEy Dr Autumn Messing,              I went to pick up that prescription, I have to be honest I didnt even want to try them because my copay was $50 for ONLY 4 pills(and it wasnt my normal way of taking), so I did not pick it up.  The pharmasist informed me I can get my NAME brand Valtrex 500mg  BID 180 pills(my old prescription) for the same copay.  In that case, I would much rather do that if you dont mind.  Let me know if you have any concerns.              I am off today, but if you could please call this in to the [facility] [place] TODAY (not the [facility], my normal pharmacy)  I would greatly appreciate it, I am home sick and when I get sick I get fever blisters and I would like to start taking immediately today.              feel free to call my cell as well if you will not be able to do this or have questions.   [phone]              Thanks so much              ----- Message -----       From: Christianne Dolin, MD       Sent: 06/06/2013  9:34 AM EDT       To: Natasha Perez       Subject: [id number]              OK just let me know if I need to redo it.              LD                     ----- Message -----          From: Natasha Perez          Sent: 06/05/2013  1:31 PM EDT            To: Christianne Dolin, MD       Subject: GM:WNUUVO              FYI               The gram pills I havent been able to tolerate in the past....they are way to big, hard to swallow plus hard on my stomach thats why I take the 500mg  and if I need to take two I do but space them out.               However, I will try these by cutting them in half( they appear scored) if that is ok, and if I cant seem to tolerate I will let you know and we can redo script.  Is that ok with you?                Plus, I only take BRAND NAME on this particular med(  others not as effective....by trail and error)- so if you can add BRAND ONLY to my script for future so my copay isnt higher than it needs to be I would truly appreciate your assistance.              Thanks so much       Have a great Day!!! :)              ----- Message -----       From: Christianne Dolin, MD       Sent: 06/05/2013 11:43 AM EDT       To: Natasha Perez       Subject: valtrex              I sent an order for valtrex to the [facility] center pharmacy.  The dose I use may be a little bit different.                LD  ------

## 2013-07-09 ENCOUNTER — Encounter (INDEPENDENT_AMBULATORY_CARE_PROVIDER_SITE_OTHER): Payer: 59 | Admitting: Internal Medicine

## 2013-07-19 ENCOUNTER — Other Ambulatory Visit: Payer: Self-pay

## 2013-07-21 ENCOUNTER — Other Ambulatory Visit: Payer: Self-pay

## 2013-07-22 ENCOUNTER — Encounter (INDEPENDENT_AMBULATORY_CARE_PROVIDER_SITE_OTHER): Payer: Self-pay | Admitting: Internal Medicine

## 2013-07-25 ENCOUNTER — Ambulatory Visit (HOSPITAL_COMMUNITY): Payer: 59

## 2013-07-25 ENCOUNTER — Encounter (INDEPENDENT_AMBULATORY_CARE_PROVIDER_SITE_OTHER): Payer: Self-pay | Admitting: Internal Medicine

## 2013-07-25 ENCOUNTER — Ambulatory Visit
Admission: RE | Admit: 2013-07-25 | Discharge: 2013-07-25 | Disposition: A | Discharge status: Home / Self Care | Payer: 59 | Source: Ambulatory Visit | Attending: Internal Medicine | Admitting: Internal Medicine

## 2013-07-25 DIAGNOSIS — Z139 Encounter for screening, unspecified: Secondary | ICD-10-CM | POA: Insufficient documentation

## 2013-07-25 LAB — LIPID PANEL
CHOLESTEROL: 158 mg/dL (ref <200)
HDL-CHOLESTEROL: 44 mg/dL (ref >39)
LDL (CALCULATED): 99 mg/dL (ref <100)
NON - HDL (CALCULATED): 114 mg/dL (ref <190)
TRIGLYCERIDES: 76 mg/dL (ref <150)
VLDL (CALCULATED): 15 mg/dL (ref <30)

## 2013-07-25 LAB — GLUCOSE FASTING: GLUCOSE, FASTING: 91 mg/dL (ref 70–105)

## 2013-07-31 ENCOUNTER — Telehealth (INDEPENDENT_AMBULATORY_CARE_PROVIDER_SITE_OTHER): Payer: Self-pay | Admitting: Internal Medicine

## 2013-07-31 NOTE — Telephone Encounter (Signed)
 Patient called said that she had talked to you at her last visit about trying to conceive. She was three days late for her menstrual cycle this month but she had started spotting on Saturday and has had on and off bleeding ever since she does not think it is her period although she took a pregnancy test and it was negative.  She said her period is usually heavy and consistent. She is worried she may be having a miscarriage because of the abdominal cramping she is having. I first suggested that she go to an ER or urgent care to make sure that is not the case, but she said that she was in [place] studying for an exam that she has tomorrow and she would have to come up here to go to the ER to avoid getting a large bill. She is on the schedule for tomorrow at 1:40 with Ulla Query. I told her that if the bleeding got worse she needed to go to the ER no matter what and she agreed and said she would call and let us  know if she would not be here.  Natasha PARAS [place] [employer], MA 07/31/2013, 12:07 PM

## 2013-08-01 ENCOUNTER — Encounter (INDEPENDENT_AMBULATORY_CARE_PROVIDER_SITE_OTHER): Payer: Self-pay | Admitting: Medical

## 2013-08-01 ENCOUNTER — Ambulatory Visit
Admission: RE | Admit: 2013-08-01 | Discharge: 2013-08-01 | Disposition: A | Discharge status: Home / Self Care | Payer: 59 | Source: Ambulatory Visit | Attending: Internal Medicine | Admitting: Internal Medicine

## 2013-08-01 ENCOUNTER — Ambulatory Visit (HOSPITAL_BASED_OUTPATIENT_CLINIC_OR_DEPARTMENT_OTHER): Payer: 59 | Admitting: Medical

## 2013-08-01 ENCOUNTER — Ambulatory Visit (HOSPITAL_BASED_OUTPATIENT_CLINIC_OR_DEPARTMENT_OTHER)
Admission: RE | Admit: 2013-08-01 | Discharge: 2013-08-01 | Disposition: A | Discharge status: Home / Self Care | Payer: 59 | Source: Ambulatory Visit | Attending: Medical | Admitting: Medical

## 2013-08-01 VITALS — BP 134/86 | HR 89 | Temp 98.6°F | Ht 70.5 in | Wt 229.5 lb

## 2013-08-01 DIAGNOSIS — N926 Irregular menstruation, unspecified: Secondary | ICD-10-CM | POA: Insufficient documentation

## 2013-08-01 DIAGNOSIS — N979 Female infertility, unspecified: Secondary | ICD-10-CM | POA: Insufficient documentation

## 2013-08-01 LAB — HCG, PLASMA OR SERUM QUANTITATIVE, PREGNANCY: HCG QUANTITATIVE/PREG: <1 m[IU]/mL (ref <5)

## 2013-08-01 MED ORDER — VALACYCLOVIR 500 MG TABLET
500.0000 mg | ORAL_TABLET | Freq: Two times a day (BID) | ORAL | Status: DC
Start: 2013-08-01 — End: 2014-02-11

## 2013-08-01 NOTE — Patient Instructions (Addendum)
Patient for stat lab. For pelvic ultrasound as well. Will advise results of [place].

## 2013-08-01 NOTE — Progress Notes (Addendum)
[facility]   Operated by [facility]   [address]   [place] [address]   Dept: [phone]   Return Outpatient Note    Natasha Perez  [id number]  05/14/1971    08/01/2013    Chief Complaint   Patient presents with   . Dysmenorrhea     for 3-4 days   . Irregular Menses       Subjective:  Patient is a 42 year old female seen today for evaluation of irregular menstrual bleeding and pelvic pain. Patient has been attempting to get pregnant, and was 5 days late on her menstrual cycle, when she developed some significant pain and cramping. Patient then developed some spotting 3 days ago, and has had increased bleeding since that time as well. Patient did a home pregnancy test which was negative. Patient has been pregnant in the past, but miscarried at about 16 weeks. No dizziness or lightheadedness reported. No fever or chills. Patient also with a history of a ectopic pregnancy several years ago.    Patient Active Problem List    Diagnosis Date Noted   . Melanoma 05/27/2010   . Fibroid 05/27/2010   . Back pain 05/27/2010   . Vitamin D deficiency 05/27/2010       Outpatient Prescriptions Prior to Visit:  cyclobenzaprine (FLEXERIL) 10 mg Oral Tablet Take 1 Tab (10 mg total) by mouth Three times a day   Diclofenac Epolamine 1.3 % Transdermal Patch 12 hr 180 mg by Transdermal route Twice daily Please disregard prescription for diclofenac gel, use this instead   ergocalciferol, vitamin D2, (DRISDOL) 50,000 unit Oral Capsule Take 1 Cap (50,000 Int'l Units total) by mouth Every 7 days   Ibuprofen (MOTRIN) 600 mg Oral Tablet Take 1 Tab (600 mg total) by mouth Four times a day as needed for Pain   multivitamin Tab take 1 Tab by mouth Once a day.   ondansetron (ZOFRAN ODT) 4 mg Oral Tablet, Rapid Dissolve 1 Tab (4 mg total) by Sublingual route Every 8 hours as needed for nausea/vomiting   scopolamine (TRANSDERM SCOP) 1.5 mg Transdermal Patch 72 hr 1 Patch (1.5 mg total) by Transdermal route Every 72 hours    valACYclovir (VALTREX) 500 mg Oral Tablet Take 1 Tab (500 mg total) by mouth Twice daily BRAND ONLY     No facility-administered medications prior to visit.  Allergies   Allergen Reactions   . Iodine-Iodine Containing Itching, Swelling and Hives/ Urticaria   . Sulfa (Sulfonamides) Nausea/ Vomiting         Objective:BP 134/86   Pulse 89   Temp(Src) 37 C (98.6 F) (Tympanic)   Ht 1.791 m (5' 10.5")   Wt 104.1 kg (229 lb 8 oz)   BMI 32.45 kg/m2   SpO2 96%  Patient is alert and cooperative. Vital signs as noted.  Lungs are clear to auscultate. Heart was regular rate and rhythm.  Abdomen was soft with reported tenderness in the suprapubic area, without guarding or rebound.    Assessment and Plan:  1. Irregular menstrual bleeding    Stat serum pregnancy test ordered, and did request a pelvic ultrasound.   - Hcg Quant/Serum Pregnancy; Future  - [place] FEMALE PELVIS; Future  - AMB CONSULT/REFERRAL INFERTILITY; Future    2. Infertility    Did refer patient to the infertility clinic. Patient encouraged to call and get information regarding covered expenses and other cost.  - AMB CONSULT/REFERRAL INFERTILITY; Future    Patient to followup here if needed. Will continue  Tylenol.  The patient was seen independently with the co-signing faculty present in clinic.    Orders Placed This Encounter   . [place] FEMALE PELVIS   . Hcg Quant/Serum Pregnancy   . AMB CONSULT/REFERRAL INFERTILITY   . valACYclovir (VALTREX) 500 mg Oral Tablet     Amaziah Ghosh PA-C  [facility]  Operated by [facility]  [address]  [place] [address]  Dept: [phone]  Dept Fax: [phone]        The patient was seen independently by the physician's assistant.  I was immediately available in the patient care area for assistance if needed.    Freddie Apley. Mikey College, MD    Assistant Professor  [facility] of Medicine  Department of Internal Medicine

## 2013-08-12 ENCOUNTER — Ambulatory Visit (INDEPENDENT_AMBULATORY_CARE_PROVIDER_SITE_OTHER): Payer: 59 | Admitting: Reproductive Endocrinology

## 2013-08-12 ENCOUNTER — Encounter (INDEPENDENT_AMBULATORY_CARE_PROVIDER_SITE_OTHER): Payer: Self-pay | Admitting: Reproductive Endocrinology

## 2013-08-12 ENCOUNTER — Encounter (INDEPENDENT_AMBULATORY_CARE_PROVIDER_SITE_OTHER): Payer: 59 | Admitting: Reproductive Endocrinology

## 2013-08-12 VITALS — BP 144/82 | Ht 69.61 in | Wt 232.4 lb

## 2013-08-12 DIAGNOSIS — Z319 Encounter for procreative management, unspecified: Secondary | ICD-10-CM

## 2013-08-12 HISTORY — DX: Encounter for procreative management, unspecified: Z31.9

## 2013-08-12 NOTE — H&P (Signed)
HPI:  The patient is a 42 y.o. G1 P0010 who presents with secondary infertility .    Growth and development were normal with menarche at age 75-13.  Menses occurred at 28 day intervals with 10 days of heavy flow with dysmenorrhea. Tried OCs to decrease flow but didn't tolerate the pills.  She was involved in sports (basketball).  Went to [place].  Menses now occur @ 26 day intervals with 4 days of flow except for the last cycle which was 30 days.   She denies hirsutism, acne, or galactorrhea.  OPKs are positive    She was previously married from 2001 until 2011.  Her prior pregnancy was with a former partner.  She is unmarried and has been with her present partner since Feb 2014 and attempting pregnancy since March.     Prior contraception includes the use of OCs in her teens for menorrhagia.  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  She and her partner have intercourse 4 times per week.  She denies the use of douche or lubricants.  She has increased lubrication with sexual activity.    Prior evaluation and therapy is as follows:   Female:   2005:  Ectopic:  MTX, D&C   2005:  Laparoscopic removal of pedunculated myoma (goretex patch), chromotubation (? Abnormality of right tube)   08/01/13:  Ultrasound:  ? 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, proven fertility x 3 (youngest age 16)   Childhood illnesses and developmental history:  none   Previous surgery: none   Systemic medical ilnesses: dental   Past episodes or exposures to STDs: none   Exposures to environmental toxins: none   Current medications: none   Allergies: none   Prior use of  Tobacco and alcohol.*   Semen Analysis: Untested    PMH:   Medical: Myomectomy (pedunculated), HSV, melanoma on right thigh (1999)   Obstetric:  1) 2005:  MTX   Surgical: D&C, myomectomy, excision of melanoma    Meds:  PNV, Vitamin D, Valtrex, Ibuprofen for dysmenorrhea   Allergies:  Sulfa Iodine   Habits: None   Other:   none    FH:   Mother: Died at 52 with CVA, had history of endometriosis with hyst, had DM   Father: Died at 40, ? etiology     Sibs:  none   Other:  MGGM with DM    SH:  Production designer, theatre/television/film    ROS:     HEENT: normal     Cardiac: normal      Resp:  normal   GI:  normal   GU: normal   Skin: normal   Neurologic: normal   Psychiatric: normal   Endocrine: normal   MSK: normal   Hematologic: normal   Immune: normal    PE:  BP 144/82   Ht 1.768 m (5' 9.61")   Wt 105.4 kg (232 lb 5.8 oz)   BMI 33.72 kg/m2   LMP 08/01/2013     General: Class I obesity   Skin:  Normal    Neck: Without masses, Thyroid normal   Respiratory    Effort: Normal pulmonary excursion    Ausc Lungs: Lungs clear to percussion and auscultation   Cardiovascular    Exam:  No thrills or heaves    Ausc heart:  Normal S1-S2, Without murmurs    Peripheral vasculature:  normal   Gastrointestinal:      Abdomen:  Soft, nontender without obvious masses    Hernia:  No evidence of umbilical, ventral wall, inguinal or femoral hernias    Liver:  No evidence of hepatomegally.  No RUQ tenderness    Spleen:  No evidence of splenomegally   Lymphatic: No evidence of paracervical, submandibular, supraclavicular adenopathy.   Neuro/Psych:  Oriented to person, place and time.  Mood and affect are normal.   Female Gynecologic:     External Genitalia: Normal vulva and labia.      Ureth meatus:  Normal    Bladder:  Normal    Vagina/pelv. supp:  No evidence of cystocoele, rectocoele, or enterocoele    Cervix:  Nullip, without lesions, clear mucus, normal os    Uterus:  Anterior, normal size, shape, and consistency, 13.9 mm solid pattern endometrium    Adnexa/parametrium:  Without masses or nodularity, 18.3 mm complex cyst on the right, 30 mm unilocular cyst on the right, ? AFC of 4-5 on the left     Anus/Perineum:  Without lesions.  No hemorrhoids or evidence of fistula    Assessment:   Secondary infertility    Couple: ok   Female: proven, untested, advanced paternal age   New Autosomal dominant disorders: Marfan's syndrome, Achondroplastic dwarfs and various syndromes affecting the skull and facial bones (0.3 to 0.5%)    ? Other congenital defects (? NTD, cardiac, limb, Wilm's tumor)    Increase in Schizophrenia    ? Increase in x-linked disease in grandchildren (hemophilia, duchenne muscular dystrophy)   ? Increase in leukemia and CNS cancers   Female   Ovulation: Regular menses, large preovulatory follicle, appears to have ovulated on the right for the past 2 months   Cervix: Clear mucus   Uterus: Prior myomectomy, thickened endometrium, does not have trilaminar endometrium   Tubal: Prior ectopic   Peritoneal: Prior laparoscopic surgery   Ovarian reserve: Age related decreased fecundability, 1/24 genetic risk, has antral follicles on the left    Plan  1.  Semen analysis  2.  Consider ovulation induction to force left-sided ovulation or COH/IUI  3.  Consider HSG    Amie Portland, MD 08/12/2013, 5:03 PM

## 2013-09-01 ENCOUNTER — Ambulatory Visit (HOSPITAL_BASED_OUTPATIENT_CLINIC_OR_DEPARTMENT_OTHER): Payer: 59 | Admitting: Obstetrics & Gynecology

## 2013-09-01 ENCOUNTER — Ambulatory Visit
Admission: RE | Admit: 2013-09-01 | Discharge: 2013-09-01 | Disposition: A | Discharge status: Home / Self Care | Payer: 59 | Source: Ambulatory Visit | Attending: Obstetrics & Gynecology | Admitting: Obstetrics & Gynecology

## 2013-09-01 ENCOUNTER — Encounter (HOSPITAL_BASED_OUTPATIENT_CLINIC_OR_DEPARTMENT_OTHER): Payer: Self-pay | Admitting: Obstetrics & Gynecology

## 2013-09-01 ENCOUNTER — Other Ambulatory Visit (HOSPITAL_BASED_OUTPATIENT_CLINIC_OR_DEPARTMENT_OTHER): Payer: Self-pay | Admitting: Obstetrics & Gynecology

## 2013-09-01 DIAGNOSIS — Z01419 Encounter for gynecological examination (general) (routine) without abnormal findings: Secondary | ICD-10-CM | POA: Insufficient documentation

## 2013-09-01 DIAGNOSIS — Z1151 Encounter for screening for human papillomavirus (HPV): Secondary | ICD-10-CM | POA: Insufficient documentation

## 2013-09-01 NOTE — Progress Notes (Addendum)
Natasha Perez  1971/04/22  [id number]      GYN Annual Exam  09/01/2013    CC:  Here for annual exam    HPI:  42 y.o. female G1P0010 here for annual exam.  No complaints or concerns today.  Has moved back to [place] from [place] in last few years and is trying to establish care here in town. Recently saw Dr. Parthenia Ames in 07/2013 for secondary infertility workup.    OB/GYN Hx:  OB:  G1P0010   1 - 2005 - ectopic - MTX, D&C  LMP:  Patient's last menstrual period was 08/24/2013.  Menses Q month with 26-30 day cycle; 4-5 days flow  Last Pap:  Neg per pt - done last year  Hx of Abnormal Paps:  One abnormal pap in 2005; reports repeat pap NEG  Contraception:  Trying to conceive - recently saw Dr. Parthenia Ames, infertility workup  Sexual Hx:  boyfriend    PMH:  Past Medical History   Diagnosis Date    Melanoma 1999     right thigh, had 2 or 3 surgeries at [facility] sampled lymph nodes which were ok, still sees dermatologist yearly in [facility]    Ectopic pregnancy      2005       PSH:  Past Surgical History   Procedure Laterality Date    Hx other       3 right leg surgeries for melanoma    Hx myomectomy  2005    Hx refractive surgery         Family Hx:  Family History   Problem Relation Age of Onset    Hypertension Mother     Diabetes Mother      also in mother's grandmother    Stroke Mother 63     died of brainstem stroke    Lung Cancer Maternal Grandmother     Breast Cancer Neg Hx     Cervical Cancer Neg Hx     Rectal Cancer Neg Hx     Uterine Cancer Neg Hx     Uterine Fibroids Neg Hx     Ovarian Cancer Neg Hx     Pancreatic Cancer Neg Hx        Meds:  Current Outpatient Prescriptions   Medication Sig    cyclobenzaprine (FLEXERIL) 10 mg Oral Tablet Take 1 Tab (10 mg total) by mouth Three times a day    Diclofenac Epolamine 1.3 % Transdermal Patch 12 hr 180 mg by Transdermal route Twice daily Please disregard prescription for diclofenac gel, use this instead    ergocalciferol, vitamin D2, (DRISDOL) 50,000  unit Oral Capsule Take 1 Cap (50,000 Int'l Units total) by mouth Every 7 days    Ibuprofen (MOTRIN) 600 mg Oral Tablet Take 1 Tab (600 mg total) by mouth Four times a day as needed for Pain    multivitamin Tab take 1 Tab by mouth Once a day.    ondansetron (ZOFRAN ODT) 4 mg Oral Tablet, Rapid Dissolve 1 Tab (4 mg total) by Sublingual route Every 8 hours as needed for nausea/vomiting    scopolamine (TRANSDERM SCOP) 1.5 mg Transdermal Patch 72 hr 1 Patch (1.5 mg total) by Transdermal route Every 72 hours    valACYclovir (VALTREX) 500 mg Oral Tablet Take 1 Tab (500 mg total) by mouth Twice daily BRAND ONLY         Allergies:  Allergies   Allergen Reactions    Iodine And Iodide Containing Products Itching, Swelling and Hives/  Urticaria    Sulfa (Sulfonamides) Nausea/ Vomiting         Social:  History     Social History    Marital Status: Single     Spouse Name: N/A     Number of Children: 0    Years of Education: N/A     Occupational History    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     Social History Main Topics    Smoking status: Never Smoker     Smokeless tobacco: Not on file    Alcohol Use: No      Comment: rare    Drug Use: No    Sexual Activity: Yes     Partners: Male     Pharmacist, hospital Protection: None     Other Topics Concern    Abuse/Domestic Violence No    Breast Self Exam Yes    Caffeine Concern No    Calcium Intake Adequate Yes    Exercise Concern No     Tries to walk or bike.  Not getting as much exercise as she thinks she should.    Special Diet No     Only eating one meal a day now. Dinner is hit or miss.  Needs to eat more regularly she says.      Social History Narrative    Lives in [place].  No children. Has a boyfriend and recently stopped using protection. 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 Brendolyn Patty is scuba diving.  Was an only child.  Got undergraduate degree at [employer] and a math degree at [employer].            ROS:  ROS negative except as per  HPI    O:  Vitals:    Filed Vitals:    09/01/13 0817   BP: 106/80   Height: 1.791 m (5' 10.5")   Weight: 105.1 kg (231 lb 11.3 oz)       General:  WDWN female in NAD.  HEENT:  Normocephalic; thyroid not enlarged - no masses or nodules palpated  Lungs:  Lungs CTAB, No increased respiratory effort.  CV:  RRR   Abdomen:  Soft / ND / NTTP  Skin:  No edema or rashes.  Neuro:  CN grossly intact.  Psych:  A&O x3, pleasant mood, appropriate affect.    Breast and Pelvic Exam per Dr. Darcel Bayley  Breasts:  No masses or nodules palpated B/L; no lymphadenopathy palpated B/L  Pelvic: (used extra long Graves speculum)  Normal appearing external genitalia; normal appearing vagina and multiparous appearing cervix; anteverted uterus; no adnexal masses palpated on bimanual exam    A/P:  42 y.o. female here for annual exam    - Preventative Care   - Immunizations:  Tdap: reports up to date, Flu: received this year   - FLP, fasting blood sugar:  07/25/13 - WNL   - Mammogram:  05/16/13 - Birads Category 1 - Negative   - Pap Smear:  Will perform today   - Encouraged 30 minutes of exercise 3x/week, 3 servings calcium/day, decreased caffeine use, and home breast exams.    - Hx Melanoma   - will establish care here with Dermatology for yearly skin exams     - Return to clinic for annual exam in 1 year.    Truddie Coco, DO 09/01/2013, 8:46 AM          I saw and examined the patient.  I reviewed the  resident's note.  I agree with the findings and plan of care as documented in the resident's note.  Any exceptions/additions are edited/noted.    Heron Nay, MD 09/01/2013, 9:44 AM

## 2013-09-01 NOTE — Addendum Note (Signed)
Addended by: Freeman Caldron on: 09/01/2013 11:15 AM     Modules accepted: Orders

## 2013-09-03 LAB — HISTORICAL CYTOPATHOLOGY-GYN (PAP AND HPV TESTS)

## 2013-09-08 ENCOUNTER — Encounter (HOSPITAL_BASED_OUTPATIENT_CLINIC_OR_DEPARTMENT_OTHER): Payer: Self-pay

## 2013-09-24 ENCOUNTER — Encounter (INDEPENDENT_AMBULATORY_CARE_PROVIDER_SITE_OTHER): Payer: 59 | Admitting: Reproductive Endocrinology

## 2013-09-30 ENCOUNTER — Ambulatory Visit (HOSPITAL_BASED_OUTPATIENT_CLINIC_OR_DEPARTMENT_OTHER): Payer: 59 | Admitting: Obstetrics & Gynecology

## 2014-01-28 ENCOUNTER — Encounter (HOSPITAL_BASED_OUTPATIENT_CLINIC_OR_DEPARTMENT_OTHER): Payer: Self-pay | Admitting: Obstetrics & Gynecology

## 2014-02-11 ENCOUNTER — Other Ambulatory Visit (INDEPENDENT_AMBULATORY_CARE_PROVIDER_SITE_OTHER): Payer: Self-pay | Admitting: Internal Medicine

## 2014-02-11 ENCOUNTER — Other Ambulatory Visit (INDEPENDENT_AMBULATORY_CARE_PROVIDER_SITE_OTHER): Payer: Self-pay | Admitting: Medical

## 2014-02-11 MED ORDER — IBUPROFEN 600 MG TABLET
600.0000 mg | ORAL_TABLET | Freq: Four times a day (QID) | ORAL | Status: DC | PRN
Start: 2014-02-11 — End: 2014-02-11

## 2014-02-11 NOTE — Telephone Encounter (Signed)
From: Natasha Perez  To: Lynnda Shields, MD  Sent: 02/11/2014 3:12 PM EDT  Subject: Medication Renewal Request    Original authorizing provider: Lynnda Shields, MD    Natasha Perez would like a refill of the following medications:  Ibuprofen (MOTRIN) 600 mg Oral Tablet Lynnda Shields, MD]    Preferred pharmacy: [place], [place] - [address]    Comment:  ###Brand Only for VALTREX: 500 mg (quantity 180) ###*Ibuprofen suppose to 800mg  thank you any question please call [phone]    Medication renewals requested in this message routed to other providers:  valACYclovir (VALTREX) 500 mg Oral Tablet Junie Panning, PA-C]

## 2014-02-11 NOTE — Telephone Encounter (Signed)
From: Clancy Gourd  To: Junie Panning, PA-C  Sent: 02/11/2014 3:12 PM EDT  Subject: Medication Renewal Request    Original authorizing provider: Randalyn Rhea Darragh, PA-C    Clancy Gourd would like a refill of the following medications:  valACYclovir (VALTREX) 500 mg Oral Tablet Junie Panning, PA-C]    Preferred pharmacy: [place], [place] - [address]    Comment:  ###Brand Only for VALTREX: 500 mg (quantity 180) ###*Ibuprofen suppose to 800mg  thank you any question please call [phone]    Medication renewals requested in this message routed to other providers:  Ibuprofen (MOTRIN) 600 mg Oral Tablet Lynnda Shields, MD]

## 2014-02-12 MED ORDER — VALACYCLOVIR 500 MG TABLET
500.00 mg | ORAL_TABLET | Freq: Two times a day (BID) | ORAL | Status: DC
Start: 2014-02-11 — End: 2014-07-14

## 2014-02-12 MED ORDER — IBUPROFEN 600 MG TABLET
600.00 mg | ORAL_TABLET | Freq: Four times a day (QID) | ORAL | Status: DC | PRN
Start: 2014-02-11 — End: 2014-03-17

## 2014-03-17 ENCOUNTER — Encounter (INDEPENDENT_AMBULATORY_CARE_PROVIDER_SITE_OTHER): Payer: Self-pay | Admitting: Internal Medicine

## 2014-03-17 ENCOUNTER — Other Ambulatory Visit (INDEPENDENT_AMBULATORY_CARE_PROVIDER_SITE_OTHER): Payer: Self-pay | Admitting: Internal Medicine

## 2014-03-17 MED ORDER — ONDANSETRON 4 MG DISINTEGRATING TABLET
4.00 mg | ORAL_TABLET | Freq: Three times a day (TID) | ORAL | Status: DC | PRN
Start: 2014-03-17 — End: 2014-07-14

## 2014-03-17 MED ORDER — IBUPROFEN 800 MG TABLET
800.00 mg | ORAL_TABLET | Freq: Three times a day (TID) | ORAL | Status: DC | PRN
Start: 2014-03-17 — End: 2014-07-14

## 2014-03-17 NOTE — Telephone Encounter (Signed)
-----   Message from Delford Field, RN sent at 03/17/2014  3:03 PM EDT -----  Regarding: FW: Prescription Question  Contact: [phone]      ----- Message -----     From: Natasha Perez     Sent: 03/17/2014   2:56 PM       To: [facility] [place]  Subject: Prescription Question                            Hey there Dr Tresa Res,  I have been trying to refill ibuprofen the pharmacy keeps filling 600mg  and that is NOT correct I have and old prescription #[id number] that is 800mg  every 4 hours as needed.  This is what I always take and have for years.   I have never taken 600mg ....    Could you please correct my record and prescribe ibuprofen 800mg  every 4 hours as needed (this is what I have always taken for my cramps and back flairups) I would greatly appreciate it.Marland KitchenMarland KitchenMarland KitchenThanks so much for your time....Marland Kitchenany questions call me [phone] :)

## 2014-03-17 NOTE — Telephone Encounter (Signed)
From: Natasha Perez  To: Lynnda Shields, MD  Sent: 03/17/2014 2:57 PM EDT  Subject: Medication Renewal Request    Original authorizing provider: Lynnda Shields, MD    Natasha Perez would like a refill of the following medications:  ondansetron (ZOFRAN ODT) 4 mg Oral Tablet, Rapid Dissolve Lynnda Shields, MD]    Preferred pharmacy: [place], [place] - [address]    Comment:  requesting this be refilled.Marland KitchenMarland KitchenMarland KitchenThanks so much

## 2014-06-02 ENCOUNTER — Encounter (INDEPENDENT_AMBULATORY_CARE_PROVIDER_SITE_OTHER): Payer: Self-pay

## 2014-07-01 ENCOUNTER — Other Ambulatory Visit: Payer: Self-pay

## 2014-07-14 ENCOUNTER — Encounter (INDEPENDENT_AMBULATORY_CARE_PROVIDER_SITE_OTHER): Payer: Self-pay | Admitting: Internal Medicine

## 2014-07-14 ENCOUNTER — Telehealth (HOSPITAL_BASED_OUTPATIENT_CLINIC_OR_DEPARTMENT_OTHER): Payer: Self-pay | Admitting: Internal Medicine

## 2014-07-14 ENCOUNTER — Other Ambulatory Visit (HOSPITAL_BASED_OUTPATIENT_CLINIC_OR_DEPARTMENT_OTHER): Payer: Self-pay | Admitting: Medical

## 2014-07-14 ENCOUNTER — Other Ambulatory Visit (HOSPITAL_BASED_OUTPATIENT_CLINIC_OR_DEPARTMENT_OTHER): Payer: Self-pay | Admitting: Internal Medicine

## 2014-07-14 ENCOUNTER — Other Ambulatory Visit (INDEPENDENT_AMBULATORY_CARE_PROVIDER_SITE_OTHER): Payer: Self-pay | Admitting: Internal Medicine

## 2014-07-14 ENCOUNTER — Ambulatory Visit: Payer: Self-pay | Attending: PREVENTIVE MEDICINE-OCCUPATIONAL MEDICINE

## 2014-07-14 ENCOUNTER — Encounter (INDEPENDENT_AMBULATORY_CARE_PROVIDER_SITE_OTHER): Payer: Self-pay

## 2014-07-14 DIAGNOSIS — Z026 Encounter for examination for insurance purposes: Secondary | ICD-10-CM | POA: Insufficient documentation

## 2014-07-14 DIAGNOSIS — Z1239 Encounter for other screening for malignant neoplasm of breast: Secondary | ICD-10-CM

## 2014-07-14 LAB — LIPID PANEL
CHOLESTEROL: 164 mg/dL (ref <200)
HDL-CHOLESTEROL: 39 mg/dL — ABNORMAL LOW (ref >49)
HDL-CHOLESTEROL: 39 mg/dL — ABNORMAL LOW (ref >49)
LDL (CALCULATED): 110 mg/dL — ABNORMAL HIGH (ref <100)
LDL (CALCULATED): 110 mg/dL — ABNORMAL HIGH (ref <100)
NON - HDL (CALCULATED): 125 mg/dL (ref <190)
TRIGLYCERIDES: 76 mg/dL (ref <150)
VLDL (CALCULATED): 15 mg/dL (ref <30)

## 2014-07-14 LAB — GLUCOSE FASTING: GLUCOSE, FASTING: 86 mg/dL (ref 70–105)

## 2014-07-14 MED ORDER — ERGOCALCIFEROL (VITAMIN D2) 1,250 MCG (50,000 UNIT) CAPSULE
50000.0000 [IU] | ORAL_CAPSULE | ORAL | Status: DC
Start: 2014-07-14 — End: 2014-07-17

## 2014-07-14 MED ORDER — IBUPROFEN 800 MG TABLET
800.00 mg | ORAL_TABLET | Freq: Three times a day (TID) | ORAL | Status: DC | PRN
Start: 2014-07-14 — End: 2016-03-01

## 2014-07-14 MED ORDER — VALACYCLOVIR 500 MG TABLET
500.0000 mg | ORAL_TABLET | Freq: Two times a day (BID) | ORAL | Status: DC
Start: 2014-07-14 — End: 2016-02-29

## 2014-07-14 MED ORDER — ONDANSETRON 4 MG DISINTEGRATING TABLET
4.0000 mg | ORAL_TABLET | Freq: Three times a day (TID) | ORAL | Status: DC | PRN
Start: 2014-07-14 — End: 2016-02-29

## 2014-07-14 MED ORDER — VALACYCLOVIR 500 MG TABLET
500.0000 mg | ORAL_TABLET | Freq: Two times a day (BID) | ORAL | Status: DC
Start: 2014-07-14 — End: 2014-07-17

## 2014-07-14 NOTE — Telephone Encounter (Signed)
-----   Message from [place] sent at 07/14/2014 11:59 AM EDT -----  >> STEPHANIE D WEST 07/14/2014 11:59 AM  Patient called back is also requesting    ergocalciferol, vitamin D2, (DRISDOL) 50,000 unit Oral Capsule 12 Cap 3 05/23/2013      Sig - Route: Take 1 Cap (50,000 Int'l Units total) by mouth Every 7 days - Oral    Class: E-Rx         >> [name] 07/14/2014 11:34 AM  Doctor Name:   Lynnda Shields, MD    Date of last appointment: 08/01/13 [name]    Next scheduled visit: 07/17/14 [name]     Medication Requested:   valACYclovir (VALTREX) 500 mg Oral Tablet 180 Tab 0 02/11/2014      Sig - Route: Take 1 Tab (500 mg total) by mouth Twice daily BRAND ONLY - Oral    Class: E-Rx        ondansetron (ZOFRAN ODT) 4 mg Oral Tablet, Rapid Dissolve 15 Tab 5 03/17/2014      Sig - Route: 1 Tab (4 mg total) by Sublingual route Every 8 hours as needed for nausea/vomiting - Sublingual    Class: E-Rx                   Preferred Pharmacy: [facility], [place]    [place] [address]    Phone: [phone] Fax: [phone]    Open 24 Hours?: No

## 2014-07-14 NOTE — Telephone Encounter (Signed)
-----   Message from [place] sent at 07/14/2014 11:34 AM EDT -----  >> [name] 07/14/2014 11:34 AM  Doctor Name:   Lynnda Shields, MD    Date of last appointment: 08/01/13 [name]    Next scheduled visit: 07/17/14 [name]     Medication Requested:   valACYclovir (VALTREX) 500 mg Oral Tablet 180 Tab 0 02/11/2014      Sig - Route: Take 1 Tab (500 mg total) by mouth Twice daily BRAND ONLY - Oral    Class: E-Rx        ondansetron (ZOFRAN ODT) 4 mg Oral Tablet, Rapid Dissolve 15 Tab 5 03/17/2014      Sig - Route: 1 Tab (4 mg total) by Sublingual route Every 8 hours as needed for nausea/vomiting - Sublingual    Class: E-Rx                   Preferred Pharmacy: [facility], [place]    [place] [address]    Phone: [phone] Fax: [phone]    Open 24 Hours?: No

## 2014-07-14 NOTE — Addendum Note (Signed)
Addended by: Gaetana Michaelis on: 07/14/2014 04:39 PM     Modules accepted: Orders

## 2014-07-14 NOTE — Telephone Encounter (Signed)
-----   Message from [facility] sent at 07/14/2014  1:13 PM EDT -----  >> [facility] 07/14/2014 01:13 PM  Pt had been told by someone that her scripts for Valtrex & Vitamin D had been called in.  She's at the pharmacy now and there's nothing for her yet.  She needs to make sure these are called in and delivered to her at the [facility] by tomorrow, as she is going out of town.    >> Colletta Maryland D WEST 07/14/2014 11:59 AM  Patient called back is also requesting    ergocalciferol, vitamin D2, (DRISDOL) 50,000 unit Oral Capsule 12 Cap 3 05/23/2013      Sig - Route: Take 1 Cap (50,000 Int'l Units total) by mouth Every 7 days - Oral    Class: E-Rx         >> [name] 07/14/2014 11:34 AM  Doctor Name:   Lynnda Shields, MD    Date of last appointment: 08/01/13 [name]    Next scheduled visit: 07/17/14 [name]     Medication Requested:   valACYclovir (VALTREX) 500 mg Oral Tablet 180 Tab 0 02/11/2014      Sig - Route: Take 1 Tab (500 mg total) by mouth Twice daily BRAND ONLY - Oral    Class: E-Rx        ondansetron (ZOFRAN ODT) 4 mg Oral Tablet, Rapid Dissolve 15 Tab 5 03/17/2014      Sig - Route: 1 Tab (4 mg total) by Sublingual route Every 8 hours as needed for nausea/vomiting - Sublingual    Class: E-Rx                   Preferred Pharmacy: [facility] [place], [place]    [place] [address]    Phone: [phone] Fax: [phone]    Open 24 Hours?: No

## 2014-07-14 NOTE — Telephone Encounter (Signed)
From: Natasha Perez  To: Lynnda Shields, MD  Sent: 07/14/2014 8:36 AM EDT  Subject: Medication Renewal Request    Original authorizing provider: Lynnda Shields, MD    Natasha Perez would like a refill of the following medications:  ergocalciferol, vitamin D2, (DRISDOL) 50,000 unit Oral Capsule Lynnda Shields, MD]  valACYclovir (VALTREX) 500 mg Oral Tablet Lynnda Shields, MD]  Ibuprofen (MOTRIN) 800 mg Oral Tablet Lynnda Shields, MD]    Preferred pharmacy: [place], [place] - [address]    Comment:  Please make sure Valtrex is Brand-

## 2014-07-17 ENCOUNTER — Ambulatory Visit: Payer: 59 | Attending: Internal Medicine | Admitting: Medical

## 2014-07-17 ENCOUNTER — Encounter (HOSPITAL_BASED_OUTPATIENT_CLINIC_OR_DEPARTMENT_OTHER): Payer: Self-pay | Admitting: Medical

## 2014-07-17 VITALS — BP 126/84 | HR 65 | Temp 98.8°F | Ht 70.0 in | Wt 234.6 lb

## 2014-07-17 DIAGNOSIS — Z1231 Encounter for screening mammogram for malignant neoplasm of breast: Secondary | ICD-10-CM

## 2014-07-17 DIAGNOSIS — Z833 Family history of diabetes mellitus: Secondary | ICD-10-CM | POA: Insufficient documentation

## 2014-07-17 DIAGNOSIS — E559 Vitamin D deficiency, unspecified: Secondary | ICD-10-CM | POA: Insufficient documentation

## 2014-07-17 DIAGNOSIS — D219 Benign neoplasm of connective and other soft tissue, unspecified: Secondary | ICD-10-CM | POA: Insufficient documentation

## 2014-07-17 DIAGNOSIS — Z823 Family history of stroke: Secondary | ICD-10-CM | POA: Insufficient documentation

## 2014-07-17 DIAGNOSIS — N979 Female infertility, unspecified: Secondary | ICD-10-CM | POA: Insufficient documentation

## 2014-07-17 DIAGNOSIS — Z801 Family history of malignant neoplasm of trachea, bronchus and lung: Secondary | ICD-10-CM | POA: Insufficient documentation

## 2014-07-17 DIAGNOSIS — Z0000 Encounter for general adult medical examination without abnormal findings: Principal | ICD-10-CM | POA: Insufficient documentation

## 2014-07-17 DIAGNOSIS — M549 Dorsalgia, unspecified: Secondary | ICD-10-CM | POA: Insufficient documentation

## 2014-07-17 DIAGNOSIS — Z6833 Body mass index (BMI) 33.0-33.9, adult: Secondary | ICD-10-CM | POA: Insufficient documentation

## 2014-07-17 DIAGNOSIS — Z8249 Family history of ischemic heart disease and other diseases of the circulatory system: Secondary | ICD-10-CM | POA: Insufficient documentation

## 2014-07-17 DIAGNOSIS — Z85820 Personal history of malignant melanoma of skin: Secondary | ICD-10-CM | POA: Insufficient documentation

## 2014-07-17 NOTE — Progress Notes (Addendum)
Natasha Perez  [id number]  1971/05/08  07/17/2014    Chief Complaint   Patient presents with    Annual Wellness Exam       History of Present Illness:  Patient is a 43 year old female seen today for her wellness exam for [place]. Patient follows regularly with Dr. Tresa Perez. No recent hospitalizations or illnesses reported. No routine medical concerns overall. Patient does have a fairly distant history of melanoma, and does follow regularly with a dermatologist in [place]. No current complaints. Mammogram is needed. Patient does follow with GYN for Pap smears.      Review of Perez - ROS    Constitutional:   no unintentional weight loss; no fevers, chills, or night sweats; no dizziness or syncope  Skin:    no rash recently  Eyes:    no sudden vision change  ENT&M:   no sudden loss of hearing  Resp:    no dyspnea  Cardiac:   no Natasha or increased chest pain; exercises regularly with multiple activities  GI:    no abdominal pain  GU:    no urinary incontinence  GYN: LMP currently  UTD with pap  MSkel:    no notable muscular weakness  Neuro:    no Natasha numbness or tingling  Heme/lymph:    no enlarged lymph nodes  Allergic/immun:   not treated with steroids in past month  Allergy to iodine and sulfa  No anesthesia complications        Patient Active Problem List   Diagnosis    Melanoma    Fibroid    Back pain    Vitamin D deficiency    Infertility management    History of ectopic pregnancy     Past Medical History   Diagnosis Date    Melanoma 1999     right thigh, had 2 or 3 surgeries at [place], sampled lymph nodes which were ok, still sees dermatologist yearly in [place]    Ectopic pregnancy      2005         Past Surgical History   Procedure Laterality Date    Hx other       3 right leg surgeries for melanoma    Hx myomectomy  2005    Hx refractive surgery  2000         Current Outpatient Prescriptions   Medication Sig Dispense Refill    cyclobenzaprine (FLEXERIL) 10 mg Oral Tablet Take 1 Tab  (10 mg total) by mouth Three times a day 30 Tab 3    Diclofenac Epolamine 1.3 % Transdermal Patch 12 hr 180 mg by Transdermal route Twice daily Please disregard prescription for diclofenac gel, use this instead 30 Patch 3    Ibuprofen (MOTRIN) 800 mg Oral Tablet Take 1 Tab (800 mg total) by mouth Three times a day as needed for Pain 270 Tab 3    multivitamin Tab take 1 Tab by mouth Once a day.  0    ondansetron (ZOFRAN ODT) 4 mg Oral Tablet, Rapid Dissolve 1 Tab (4 mg total) by Sublingual route Every 8 hours as needed for nausea/vomiting 15 Tab 5    scopolamine (TRANSDERM SCOP) 1.5 mg Transdermal Patch 72 hr 1 Patch (1.5 mg total) by Transdermal route Every 72 hours 10 Patch 3    valACYclovir (VALTREX) 500 mg Oral Tablet Take 1 Tab (500 mg total) by mouth Twice daily BRAND ONLY 180 Tab 3    VITAMIN D 50,000 unit Oral Capsule TAKE  1 CAPSULE BY MOUTH ONCE WEEKLY 12 Cap 3     No current facility-administered medications for this visit.     Allergies   Allergen Reactions    Iodine And Iodide Containing Products Itching, Swelling and Hives/ Urticaria    Sulfa (Sulfonamides) Nausea/ Vomiting     Family History   Problem Relation Age of Onset    Hypertension Mother     Diabetes Mother      also in mother's grandmother    Stroke Mother 34     died of brainstem stroke    Lung Cancer Maternal Grandmother     Breast Cancer Neg Hx     Cervical Cancer Neg Hx     Rectal Cancer Neg Hx     Uterine Cancer Neg Hx     Uterine Fibroids Neg Hx     Ovarian Cancer Neg Hx     Pancreatic Cancer Neg Hx          History     Social History    Marital Status: Single     Spouse Name: N/A     Number of Children: 0    Years of Education: 18+     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     Social History Main Topics    Smoking status: Never Smoker     Smokeless tobacco: Not on file    Alcohol Use: No      Comment: rare    Drug Use: No    Sexual Activity:     Partners: Male     Patent examiner Protection: None        Comment: Partner for 12 years     Other Topics Concern    Abuse/Domestic Violence No    Breast Self Exam Yes    Caffeine Concern No    Calcium Intake Adequate Yes    Exercise Concern No     Tries to walk or bike.  Not getting as much exercise as she thinks she should.    Special Diet No     Only eating one meal a day now. Dinner is hit or miss.  Needs to eat more regularly she says.      Social History Narrative    Lives in [place].  No children. Has a boyfriend and recently stopped using protection. 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  Was an only child.  Got undergraduate degree at [place] [facility] and a math degree at [place].      Objective:BP 126/84 mmHg   Pulse 65   Temp(Src) 37.1 C (98.8 F) (Thermal Scan)   Ht 1.778 m (5\' 10" )   Wt 106.4 kg (234 lb 9.1 oz)   BMI 33.66 kg/m2   SpO2 97%  Patient is alert, cooperative, in no acute distress. Vital signs adequate.  Patient's pharynx was clear. Neck was supple without adenopathy or thyromegaly. Lungs are clear to auscultate, without wheezing or rhonchi. Heart sounds are regular rate and rhythm without significant murmur. Abdomen was soft without tenderness or organomegaly. Extremities without current edema or deformity.    Assessment and Plan:  Encounter Diagnoses   Name Primary?    Annual physical exam Yes    Encounter for screening mammogram for breast cancer      Adequate wellness labs. Mammogram ordered. Annual followup recommended. Patient to followup with dermatology annually as well.    Health Care Maintenance:  Orders Placed This Encounter    MAMMO BILATERAL SCREENING       The patient was seen independently with the co-signing faculty present in clinic.    Natasha Darragh PA-C  INTERNAL MEDICINE, [place]  Operated by [place]  [place] [id number]  Dept: [phone]  Dept Fax: [phone]      The patient was seen independently  by the 15 assistant.  I was immediately available in the patient care area for assistance if needed.    Lelan Pons. Narda Amber, MD    Assistant Professor  [facility]  Department of Internal Medicine

## 2014-07-18 NOTE — Telephone Encounter (Signed)
-----   Message from Delford Field, RN sent at 07/14/2014  9:34 AM EDT -----  Regarding: FW: Non-Urgent Medical Question  Contact: [phone]      ----- Message -----     From: Clancy Gourd     Sent: 07/14/2014   9:11 AM       To: [facility] [place]  Subject: Non-Urgent Medical Question                      I need and order for my annual bilateral mammo also please..... I was due in August  Once you place the order I will call and schedule  Thanks :)

## 2014-07-20 ENCOUNTER — Ambulatory Visit
Admission: RE | Admit: 2014-07-20 | Discharge: 2014-07-20 | Disposition: A | Discharge status: Home / Self Care | Payer: 59 | Source: Ambulatory Visit | Attending: Medical | Admitting: Medical

## 2014-07-20 DIAGNOSIS — Z1231 Encounter for screening mammogram for malignant neoplasm of breast: Secondary | ICD-10-CM | POA: Insufficient documentation

## 2014-07-20 DIAGNOSIS — Z9889 Other specified postprocedural states: Secondary | ICD-10-CM | POA: Insufficient documentation

## 2014-09-07 ENCOUNTER — Ambulatory Visit (HOSPITAL_BASED_OUTPATIENT_CLINIC_OR_DEPARTMENT_OTHER): Payer: 59 | Admitting: Obstetrics & Gynecology

## 2015-06-21 ENCOUNTER — Encounter (HOSPITAL_COMMUNITY): Payer: Self-pay

## 2015-08-15 ENCOUNTER — Other Ambulatory Visit: Payer: Self-pay

## 2015-08-20 ENCOUNTER — Encounter (HOSPITAL_COMMUNITY): Payer: Self-pay

## 2016-02-29 ENCOUNTER — Other Ambulatory Visit (HOSPITAL_BASED_OUTPATIENT_CLINIC_OR_DEPARTMENT_OTHER): Payer: Self-pay | Admitting: Internal Medicine

## 2016-02-29 NOTE — Telephone Encounter (Signed)
From: Clancy Gourd  To: Lynnda Shields, MD  Sent: 02/29/2016 12:22 PM EDT  Subject: Medication Renewal Request    Original authorizing provider: Lynnda Shields, MD    Natasha Perez would like a refill of the following medications:  VITAMIN D 50,000 unit Oral Capsule Lynnda Shields, MD]  valACYclovir (VALTREX) 500 mg Oral Tablet Lynnda Shields, MD]    Preferred pharmacy: [facility] - [place], [place] [address].    Comment:  can you please call/fax BOTH prescription to[facility] [place] please.... Address: [address] [place], [place] [address] [phone] any questions call my cell [phone]

## 2016-02-29 NOTE — Telephone Encounter (Signed)
From: Clancy Gourd  To: Lynnda Shields, MD  Sent: 02/29/2016 12:27 PM EDT  Subject: Medication Renewal Request    Original authorizing provider: Lynnda Shields, MD    Natasha Perez would like a refill of the following medications:  ondansetron (ZOFRAN ODT) 4 mg Oral Tablet, Rapid Dissolve Lynnda Shields, MD]    Preferred pharmacy: [id number] - [place], [place] - [address] AT [address].    Comment:

## 2016-02-29 NOTE — Telephone Encounter (Signed)
Regarding: Rx  ----- Message from Myrtie Neither sent at 02/29/2016 12:43 PM EDT -----  Lynnda Shields, MD  The pt states she sent my chart message for refill of Valtrex and she is having a lot of break outs on inside and outside,  and is asking if you could send Rx today to the [facility] in [place],  I explained to the patient that our office asks up to 72 hours to respond to messages. She states she is asking that anyone take care of this since she is coming through [facility] and can pick it up in a little bit.    Last apt 07-17-14 (acute)   Next apt None    Preferred Pharmacy      [facility] [place] [facility]   [place]    [place] [address]    Phone: [phone] Fax: [phone]    Not a 24 hour pharmacy; exact hours not known

## 2016-02-29 NOTE — Telephone Encounter (Signed)
Routing for immediate refill. Treasa Bradshaw Evans Lance, RN  02/29/2016, 14:52

## 2016-03-01 ENCOUNTER — Other Ambulatory Visit (HOSPITAL_BASED_OUTPATIENT_CLINIC_OR_DEPARTMENT_OTHER): Payer: Self-pay | Admitting: Internal Medicine

## 2016-03-01 MED ORDER — VALACYCLOVIR 500 MG TABLET
500.0000 mg | ORAL_TABLET | Freq: Two times a day (BID) | ORAL | 3 refills | Status: DC
Start: 2016-03-01 — End: 2016-04-18

## 2016-03-01 MED ORDER — ONDANSETRON 4 MG DISINTEGRATING TABLET
4.00 mg | ORAL_TABLET | Freq: Three times a day (TID) | ORAL | 5 refills | Status: DC | PRN
Start: 2016-03-01 — End: 2019-03-04

## 2016-03-01 MED ORDER — IBUPROFEN 800 MG TABLET
800.00 mg | ORAL_TABLET | Freq: Three times a day (TID) | ORAL | 3 refills | Status: DC | PRN
Start: 2016-03-01 — End: 2019-04-29

## 2016-03-01 NOTE — Telephone Encounter (Signed)
Pended medication for refill, Please advise. Thank you! Dorothe Pea, RN  03/01/2016, 15:01

## 2016-03-01 NOTE — Telephone Encounter (Signed)
From: Clancy Gourd  To: Lynnda Shields, MD  Sent: 03/01/2016 2:56 PM EDT  Subject: Medication Renewal Request    Original authorizing provider: Lynnda Shields, MD    Natasha Perez would like a refill of the following medications:  Ibuprofen (MOTRIN) 800 mg Oral Tablet Lynnda Shields, MD]  VITAMIN D 50,000 unit Oral Capsule Lynnda Shields, MD]    Preferred pharmacy: Other - [facility] [place] [phone]    Comment:

## 2016-03-07 ENCOUNTER — Encounter (HOSPITAL_BASED_OUTPATIENT_CLINIC_OR_DEPARTMENT_OTHER): Payer: Self-pay | Admitting: Internal Medicine

## 2016-03-07 DIAGNOSIS — Z1239 Encounter for other screening for malignant neoplasm of breast: Secondary | ICD-10-CM

## 2016-03-07 NOTE — Telephone Encounter (Signed)
-----   Message from Dorothe Pea, RN sent at 03/07/2016 11:37 AM EDT -----  Regarding: FW: Test Results Question  Contact: [phone]      ----- Message -----     From: Natasha Perez     Sent: 03/07/2016  11:33 AM       To: [facility] [place]  Subject: Test Results Question                            hello,    I will be in town this friday and was hoping to get my mammo done, I have already requested an appointment to come see you for my yearly physical/check up and discuss the results. Could you place the order for a bilateral mammo please so i can get it completed prior to my appt with you.    Thanks so much  [employer]

## 2016-03-10 ENCOUNTER — Ambulatory Visit
Admission: RE | Admit: 2016-03-10 | Discharge: 2016-03-10 | Disposition: A | Discharge status: Home / Self Care | Payer: BC Managed Care – PPO | Source: Ambulatory Visit | Attending: Internal Medicine | Admitting: Internal Medicine

## 2016-03-10 ENCOUNTER — Encounter (HOSPITAL_BASED_OUTPATIENT_CLINIC_OR_DEPARTMENT_OTHER): Payer: Self-pay

## 2016-03-10 DIAGNOSIS — Z1231 Encounter for screening mammogram for malignant neoplasm of breast: Secondary | ICD-10-CM | POA: Insufficient documentation

## 2016-03-10 DIAGNOSIS — Z1239 Encounter for other screening for malignant neoplasm of breast: Secondary | ICD-10-CM

## 2016-04-18 ENCOUNTER — Ambulatory Visit (HOSPITAL_BASED_OUTPATIENT_CLINIC_OR_DEPARTMENT_OTHER): Payer: BC Managed Care – PPO

## 2016-04-18 ENCOUNTER — Encounter (HOSPITAL_BASED_OUTPATIENT_CLINIC_OR_DEPARTMENT_OTHER): Payer: Self-pay | Admitting: Medical

## 2016-04-18 ENCOUNTER — Ambulatory Visit: Payer: BC Managed Care – PPO | Attending: Internal Medicine | Admitting: Medical

## 2016-04-18 VITALS — BP 126/84 | HR 68 | Temp 97.9°F | Ht 70.0 in | Wt 233.0 lb

## 2016-04-18 DIAGNOSIS — K625 Hemorrhage of anus and rectum: Secondary | ICD-10-CM

## 2016-04-18 DIAGNOSIS — E559 Vitamin D deficiency, unspecified: Secondary | ICD-10-CM

## 2016-04-18 DIAGNOSIS — Z0000 Encounter for general adult medical examination without abnormal findings: Secondary | ICD-10-CM | POA: Insufficient documentation

## 2016-04-18 DIAGNOSIS — Z6833 Body mass index (BMI) 33.0-33.9, adult: Secondary | ICD-10-CM

## 2016-04-18 LAB — CBC WITH DIFF
BASOPHIL #: 0.04 x10ˆ3/uL (ref 0.00–0.20)
BASOPHIL %: 1 %
EOSINOPHIL #: 0.06 x10ˆ3/uL (ref 0.00–0.50)
EOSINOPHIL %: 1 %
HCT: 44.6 % (ref 33.5–45.2)
HGB: 15.4 g/dL — ABNORMAL HIGH (ref 11.2–15.2)
LYMPHOCYTE #: 1.87 x10ˆ3/uL (ref 1.00–4.80)
LYMPHOCYTE %: 25 %
MCH: 30.1 pg (ref 27.4–33.0)
MCHC: 34.5 g/dL (ref 32.5–35.8)
MCV: 87.2 fL (ref 78.0–100.0)
MONOCYTE #: 0.62 x10ˆ3/uL (ref 0.30–1.00)
MONOCYTE %: 8 %
MONOCYTE %: 8 %
MPV: 6.9 fL — ABNORMAL LOW (ref 7.5–11.5)
NEUTROPHIL #: 4.86 x10ˆ3/uL (ref 1.50–7.70)
NEUTROPHIL %: 65 %
NEUTROPHIL %: 65 %
PLATELETS: 389 x10ˆ3/uL (ref 140–450)
RBC: 5.12 x10ˆ6/uL — ABNORMAL HIGH (ref 3.63–4.92)
RDW: 13.8 % (ref 12.0–15.0)
WBC: 7.4 x10ˆ3/uL (ref 3.5–11.0)

## 2016-04-18 LAB — BASIC METABOLIC PANEL
ANION GAP: 9 mmol/L (ref 4–13)
BUN/CREA RATIO: 13 (ref 6–22)
BUN: 11 mg/dL (ref 8–25)
CALCIUM: 9.8 mg/dL (ref 8.5–10.2)
CHLORIDE: 104 mmol/L (ref 96–111)
CO2 TOTAL: 24 mmol/L (ref 22–32)
CREATININE: 0.84 mg/dL (ref 0.49–1.10)
ESTIMATED GFR: >59 mL/min/1.73mˆ2 (ref >59)
GLUCOSE: 85 mg/dL (ref 65–139)
POTASSIUM: 4 mmol/L (ref 3.5–5.1)
SODIUM: 137 mmol/L (ref 136–145)

## 2016-04-18 LAB — THYROID STIMULATING HORMONE WITH FREE T4 REFLEX: TSH: 1.163 u[IU]/mL (ref 0.350–5.000)

## 2016-04-18 MED ORDER — VALACYCLOVIR 500 MG TABLET
500.0000 mg | ORAL_TABLET | Freq: Two times a day (BID) | ORAL | 3 refills | Status: DC
Start: 2016-04-18 — End: 2019-03-04

## 2016-04-18 MED ORDER — CHOLECALCIFEROL (VITAMIN D3) 1,250 MCG (50,000 UNIT) CAPSULE
ORAL_CAPSULE | ORAL | 0 refills | Status: DC
Start: 2016-04-18 — End: 2019-03-04

## 2016-04-18 NOTE — Patient Instructions (Addendum)
Will check vit D level. Will get routine labs for review. Follow up annually. Call [phone] to check on scheduling. Consider Dr. Katina Degree if he is available.

## 2016-04-18 NOTE — Progress Notes (Signed)
Natasha Perez  [id number]  October 21, 1970  04/18/2016    Chief Complaint   Patient presents with    Employment Physical       History of Present Illness:  Patient is a 45 year old female seen today for a pre-employment physical.  Patient needs to advise new employer that she is healthy, able to work full time with no restrictions, and has no communicable diseases.  Patient is up-to-date with her immunizations.  No recent hospitalizations or illnesses reported.  No known history of coronary artery disease, hypertension, diabetes, chronic kidney disease, COPD, or peripheral vascular disease.       Review of Systems -   ROS  Constitutional:   no unintentional weight loss; no fevers, chills, or night sweats; no dizziness or syncope  Skin:    no rash recently  Eyes:    no sudden vision change  ENT&M:    no sudden loss of hearing  Resp:    no dyspnea  Cardiac:   no new or increased chest pain  GI:    no abdominal pain  GU:    no urinary incontinence  MSkel:    no notable muscular weakness  Neuro:    no new numbness or tingling  Heme/lymph:    no enlarged lymph nodes  Allergic/immun:   not treated with steroids in past month  Iodine and sulfa allergy  No anesthesia complications      Patient Active Problem List   Diagnosis    Melanoma ([place])    Fibroid    Back pain    Vitamin D deficiency    Infertility management    History of ectopic pregnancy     Past Medical History:   Diagnosis Date    Ectopic pregnancy     2005    Melanoma ([place]) 1999    right thigh, had 2 or 3 surgeries at [facility], sampled lymph nodes which were ok, still sees dermatologist yearly in [place]         Past Surgical History:   Procedure Laterality Date    HX BREAST BIOPSY Right     needle bx.  age  4    benign    HX MYOMECTOMY  2005    HX OTHER      3 right leg surgeries for melanoma    HX REFRACTIVE SURGERY  2000         Current Outpatient Prescriptions   Medication Sig Dispense Refill    Cholecalciferol, Vitamin D3, 50,000 unit Oral  Capsule One weekly for 3 months 12 Cap 0    cyclobenzaprine (FLEXERIL) 10 mg Oral Tablet Take 1 Tab (10 mg total) by mouth Three times a day (Patient not taking: Reported on 04/18/2016) 30 Tab 3    Diclofenac Epolamine 1.3 % Transdermal Patch 12 hr 180 mg by Transdermal route Twice daily Please disregard prescription for diclofenac gel, use this instead 30 Patch 3    Ibuprofen (MOTRIN) 800 mg Oral Tablet Take 1 Tab (800 mg total) by mouth Three times a day as needed for Pain 270 Tab 3    multivitamin Tab take 1 Tab by mouth Once a day.  0    ondansetron (ZOFRAN ODT) 4 mg Oral Tablet, Rapid Dissolve 1 Tab (4 mg total) by Sublingual route Every 8 hours as needed for nausea/vomiting 15 Tab 5    scopolamine (TRANSDERM SCOP) 1.5 mg Transdermal Patch 72 hr 1 Patch (1.5 mg total) by Transdermal route Every 72 hours 10 Patch 3  valACYclovir (VALTREX) 500 mg Oral Tablet Take 1 Tab (500 mg total) by mouth Twice daily BRAND ONLY 180 Tab 3    VITAMIN D 50,000 unit Oral Capsule TAKE 1 CAPSULE BY MOUTH ONCE WEEKLY 12 Cap 3     No current facility-administered medications for this visit.      Allergies   Allergen Reactions    Iodine And Iodide Containing Products Itching, Swelling and Hives/ Urticaria    Sulfa (Sulfonamides) Nausea/ Vomiting     Family History   Problem Relation Age of Onset    Hypertension Mother     Diabetes Mother      also in mother's grandmother    Stroke Mother 35     died of brainstem stroke    Lung Cancer Maternal Grandmother     Cancer Other      unknown    Breast Cancer Neg Hx     Cervical Cancer Neg Hx     Rectal Cancer Neg Hx     Uterine Cancer Neg Hx     Uterine Fibroids Neg Hx     Ovarian Cancer Neg Hx     Pancreatic Cancer Neg Hx          Social History     Social History    Marital status: Single     Spouse name: N/A    Number of children: 0    Years of education: 18+     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     Social History Main Topics    Smoking  status: Never Smoker    Smokeless tobacco: Not on file    Alcohol use No      Comment: rare    Drug use: No    Sexual activity: Yes     Partners: Male     Birth control/ protection: None      Comment: Partner for 12 years     Other Topics Concern    Abuse/Domestic Violence No    Breast Self Exam Yes    Caffeine Concern No    Calcium Intake Adequate Yes    Exercise Concern No     Tries to walk or bike.  Not getting as much exercise as she thinks she should.    Special Diet No     Only eating one meal a day now. Dinner is hit or miss.  Needs to eat more regularly she says.      Social History Narrative    Lives in [place].  No children. 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  Was an only child.  Got undergraduate degree at [employer] and a math degree at [employer].      Objective:BP 126/84   Pulse 68   Temp 36.6 C (97.9 F) (Thermal Scan)    Ht 1.778 m (5\' 10" )   Wt 105.7 kg (233 lb 0.4 oz)   LMP 04/09/2016   SpO2 99%   Breastfeeding? No   BMI 33.44 kg/m2  Patient is alert, cooperative, in no acute distress. Vital signs adequate.  Patient's pharynx was clear. Neck was supple without adenopathy or thyromegaly. Lungs are clear to auscultate, without wheezing or rhonchi. Heart sounds are regular rate and rhythm without significant murmur. Abdomen was soft without tenderness or organomegaly. Extremities without current edema or deformity.    Assessment and Plan:  1. PE (physical exam), annual  Adequate exam.  Routine  labs ordered for review.     2. Vitamin D deficiency  Has taken high dose supplemental vitamin-D.  Will review level and advise.    - VITAMIN D, SERUM (25 HYDROXYVITAMIN D2 AND D3 BY MS); Future  - Cholecalciferol, Vitamin D3, 50,000 unit Oral Capsule; One weekly for 3 months  Dispense: 12 Cap; Refill: 0  3. Episodic rectal bleeding:     Colonoscopy ordered.  The patient encouraged to have this testing done.    Disposition:  Patient given  prescription stating she can work full-time without restrictions, and has no communicable diseases.    Health Care Maintenance:    Orders Placed This Encounter    Endoscopy Request    VITAMIN D, SERUM (25 HYDROXYVITAMIN D2 AND D3 BY MS)    TSH with Free T4 Reflex    CBC/DIFF    Basic Metabolic Panel Non fasting    valACYclovir (VALTREX) 500 mg Oral Tablet    Cholecalciferol, Vitamin D3, 50,000 unit Oral Capsule       The patient was seen independently with the co-signing faculty present in clinic.    Lillia Abed PA-C  INTERNAL MEDICINE, [facility]  Operated by [facility]  [address] [place] [address]  Dept: [phone]  Dept Fax: [phone]

## 2016-04-20 ENCOUNTER — Other Ambulatory Visit (HOSPITAL_BASED_OUTPATIENT_CLINIC_OR_DEPARTMENT_OTHER): Payer: Self-pay | Admitting: Internal Medicine

## 2016-04-20 LAB — VITAMIN D, SERUM (25 HYDROXYVITAMIN D2 AND D3 BY MS)
25-HYDROXYVIT D2/D3,TOTAL: 33.2 ng/mL (ref 20.0–150.0)
25-HYDROXYVITAMIN D2: 18.6 ng/mL
25-HYDROXYVITAMIN D3: 14.6 ng/mL

## 2016-04-21 ENCOUNTER — Ambulatory Visit
Admission: RE | Admit: 2016-04-21 | Discharge: 2016-04-21 | Disposition: A | Discharge status: Home / Self Care | Payer: BC Managed Care – PPO | Source: Ambulatory Visit

## 2016-04-21 ENCOUNTER — Encounter (HOSPITAL_COMMUNITY): Payer: Self-pay

## 2016-04-21 MED ORDER — ERGOCALCIFEROL (VITAMIN D2) 1,250 MCG (50,000 UNIT) CAPSULE
50000.00 [IU] | ORAL_CAPSULE | ORAL | 3 refills | Status: DC
Start: 2016-04-21 — End: 2019-03-04

## 2016-04-21 NOTE — Telephone Encounter (Signed)
From: Natasha Perez  To: Lynnda Shields, MD  Sent: 04/20/2016 4:58 PM EDT  Subject: Medication Renewal Request    Original authorizing provider: Lynnda Shields, MD    Natasha Perez would like a refill of the following medications:  VITAMIN D 50,000 unit Oral Capsule Lynnda Shields, MD]    Preferred pharmacy: [facility], [place]:

## 2016-04-26 ENCOUNTER — Ambulatory Visit (HOSPITAL_BASED_OUTPATIENT_CLINIC_OR_DEPARTMENT_OTHER): Payer: BC Managed Care – PPO | Admitting: Certified Registered"

## 2016-04-26 ENCOUNTER — Encounter (HOSPITAL_COMMUNITY)
Admission: RE | Disposition: A | Discharge status: Home / Self Care | Payer: Self-pay | Source: Ambulatory Visit | Attending: Gastroenterology

## 2016-04-26 ENCOUNTER — Ambulatory Visit (HOSPITAL_BASED_OUTPATIENT_CLINIC_OR_DEPARTMENT_OTHER): Payer: BC Managed Care – PPO | Admitting: Gastroenterology

## 2016-04-26 ENCOUNTER — Encounter (HOSPITAL_COMMUNITY): Payer: Self-pay

## 2016-04-26 ENCOUNTER — Inpatient Hospital Stay
Admission: RE | Admit: 2016-04-26 | Discharge: 2016-04-26 | Disposition: A | Discharge status: Home / Self Care | Payer: BC Managed Care – PPO | Source: Ambulatory Visit | Attending: Gastroenterology | Admitting: Gastroenterology

## 2016-04-26 ENCOUNTER — Ambulatory Visit (HOSPITAL_COMMUNITY): Payer: BC Managed Care – PPO | Admitting: Certified Registered"

## 2016-04-26 DIAGNOSIS — K648 Other hemorrhoids: Secondary | ICD-10-CM

## 2016-04-26 DIAGNOSIS — K921 Melena: Secondary | ICD-10-CM

## 2016-04-26 DIAGNOSIS — K5730 Diverticulosis of large intestine without perforation or abscess without bleeding: Secondary | ICD-10-CM

## 2016-04-26 HISTORY — DX: Malignant (primary) neoplasm, unspecified (CMS HCC): C80.1

## 2016-04-26 LAB — HCG, SERUM QUALITATIVE, PREGNANCY: PREGNANCY, SERUM QUALITATIVE: NEGATIVE

## 2016-04-26 SURGERY — COLONOSCOPY
Anesthesia: Monitor Anesthesia Care | Site: Anus | Wound class: Clean Contaminated Wounds-The respiratory, GI, Genital, or urinary

## 2016-04-26 MED ORDER — LIDOCAINE (PF) 100 MG/5 ML (2 %) INTRAVENOUS SYRINGE
INJECTION | Freq: Once | INTRAVENOUS | Status: DC | PRN
Start: 2016-04-26 — End: 2016-04-26
  Administered 2016-04-26: 50 mg via INTRAVENOUS

## 2016-04-26 MED ORDER — LACTATED RINGERS INTRAVENOUS SOLUTION
INTRAVENOUS | Status: DC | PRN
Start: 2016-04-26 — End: 2016-04-26

## 2016-04-26 MED ORDER — SODIUM CHLORIDE 0.9 % (FLUSH) INJECTION SYRINGE
2.0000 mL | INJECTION | Freq: Three times a day (TID) | INTRAMUSCULAR | Status: DC
Start: 2016-04-26 — End: 2016-04-26

## 2016-04-26 MED ORDER — PROPOFOL 10 MG/ML IV BOLUS
INJECTION | Freq: Once | INTRAVENOUS | Status: DC | PRN
Start: 2016-04-26 — End: 2016-04-26
  Administered 2016-04-26: 100 mg via INTRAVENOUS
  Administered 2016-04-26 (×3): 20 mg via INTRAVENOUS
  Administered 2016-04-26: 30 mg via INTRAVENOUS
  Administered 2016-04-26: 100 mg via INTRAVENOUS
  Administered 2016-04-26 (×3): 20 mg via INTRAVENOUS
  Administered 2016-04-26: 30 mg via INTRAVENOUS
  Administered 2016-04-26: 20 mg via INTRAVENOUS

## 2016-04-26 MED ORDER — LACTATED RINGERS INTRAVENOUS SOLUTION
INTRAVENOUS | Status: DC
Start: 2016-04-26 — End: 2016-04-26

## 2016-04-26 MED ORDER — SODIUM CHLORIDE 0.9 % (FLUSH) INJECTION SYRINGE
2.0000 mL | INJECTION | INTRAMUSCULAR | Status: DC | PRN
Start: 2016-04-26 — End: 2016-04-26

## 2016-04-26 MED ADMIN — propofol 10 mg/mL intravenous emulsion: INTRAVENOUS | @ 15:00:00

## 2016-04-26 MED ADMIN — sodium chloride 0.9 % (flush) injection syringe: INTRAVENOUS | @ 14:00:00

## 2016-04-26 SURGICAL SUPPLY — 69 items
CANNULA INJ 17GA NDLS SYRG BLUNT STRL LF  10ML BD INTRLNK PLASTIC BXTR INTLNK ABT LS MCGAW SAFELINE (IV TUBING & ACCESSORIES) IMPLANT
CANNULA NASAL 14FT ANGL FLXB LIP PLATE CRSH RS LUM TUBE NFLR TP ADULT ARLF UCIT STD CURVE LF  DISP (CANNULA) IMPLANT
CANNULA NASAL 14FT ANGL FLXB L_IP PLATE CRSH RS LUM TUBE NFLR (CANNULA)
CANNULA NASAL 7FT ANGL FLXB LIP PLATE CRSH RS LUM TUBE FLR TIP ADULT ARLF UCIT STD CURVE LF  DISP (CANNULA) IMPLANT
CANNULA NASAL 7FT ANGL FLXB LI_P PLATE CRSH RS LUM TUBE FLR (CANNULA)
CANNULA PLASTIC NEEDLELESS IV_303345 100/BX (IV TUBING & ACCESSORIES)
CATH ELHMST GLD PRB 10FR 300CM_BIPO RND DIST TIP STD CONN (DIAGNOSTIC)
CATH ELHMST GLD PROBE 10FR 300CM BIPOLAR RND DIST TIP STD CONN FIRM SHAFT HMGLD STRL DISP 3.7MM MN (DIAGNOSTIC) IMPLANT
CATH ELHMST GLD PROBE 7FR 300CM BIPOLAR RND DIST TIP STD CONN FIRM SHAFT HMGLD STRL DISP 2.8MM MN (DIAGNOSTIC) IMPLANT
CATH ELHMST GLD PROBE 7FR 300C_M BIPOLAR RND DIST TIP STD (DIAGNOSTIC)
CLIP HMST MR CONDITIONAL BRD CATH ROT CONTROL KNOB NO SHEATH RSL 360 235CM 2.8MM 11MM OPN (SURGICAL INSTRUMENTS) IMPLANT
CONV USE ITEM 343591 - SOLIDIFY FLUID 1500ML DSPNSR L_Q TX SOLIDIFY SFTP LTS+ DISP (STER) ×1 IMPLANT
DEVICE HEMOSTASIS CLIP 360_235CM RESOLUTION (INSTRUMENTS)
DEVICE SPEC RETR TLN 2.5MM 160CM 4 PRONG GRASPER INWRD HOOK SHEATH SS DISP (ENDOSCOPIC SUPPLIES) IMPLANT
DEVICE SPEC RETR TLN 2.5MM 160_CM 4 PRONG GRSP INWRD FACE (INSTRUMENTS ENDOMECHANICAL)
DILATOR ENDOS CRE 240CM 5.5CM 11-13.5-15MM 7.5FR ESOPH PYL BIL BAL LOW PROF GW PEBAX STRL LF  DISP (BALLOON) IMPLANT
DILATOR ENDOS CRE 240CM 5.5CM 15-16.5-18MM 7.5FR ESOPH PYL BIL BAL LOW PROF GW PEBAX STRL LF  DISP (BALLOON) IMPLANT
DILATOR ENDOS CRE 240CM 5.5CM_11-13.5-15MM 7.5FR ESOPH PYL (BALLOON)
DILATOR ENDOS CRE 240CM 5.5CM_15-16.5-18MM 7.5FR ESOPH PYL (BALLOON)
DISC USE ITEM 309153 - TRAP SPECI ARGYLE 40CC GRAD SC_REW ON CAP REM MALE CONN (Cautery Accessories)
DISCONTINUED NO SUB - JELLY LUB DYNALUBE BCTRST WATER SOL NGRS PKT STRL 5GM LF (WOUND CARE SUPPLY) IMPLANT
DISCONTINUED USE ITEM 309153 - TRAP SPECI ARGYLE 40CC GRAD SC_REW ON CAP REM MALE CONN (Cautery Accessories) IMPLANT
DISCONTINUED USE ITEM 339015 - CONTAINR STRL 10% NEUT BF FRMLN POLYPROP GRAD LEAK RST ORNG PREFL SCREW CAP FSHR HLTHCR PRTCL GRN (CHEM) IMPLANT
DISCONTINUED USE ITEM 82101 - TUBING OXYGEN 50/CS 001302 (TUBE/TUBING & SUCTION SUPPLIES) IMPLANT
ELECTRODE PATIENT RTN 9FT VLAB C30- LB RM PHSV ACRL FOAM CORD NONIRRITATE NONSENSITIZE ADH STRP (CAUTERY SUPPLIES) IMPLANT
ELECTRODE PATIENT RTN 9FT VLAB_REM C30- LB PLHSV ACRL FOAM (CAUTERY SUPPLIES)
FORCEPS BIOPSY 240CM 2.4MM RJ_4 2.8MM LRG CPC DISP ORNG (GUIDING)
FORCEPS BIOPSY MICROMESH TTH STREAMLINE CATH 240CM 2.4MM RJ 4 SS LRG CPC STRL DISP ORNG 2.8MM WRK (GUIDING) IMPLANT
FORCEPS BIOPSY NEEDLE 160CM 1.8MM RJ 4 DISP YW 2MM WRK CHNL GSPED (SURGICAL INSTRUMENTS) IMPLANT
FORCEPS BIOPSY NEEDLE 160CM 1._8MM RJ 4 PED 2+ MM DISP (INSTRUMENTS)
FORCEPS BIOPSY NEEDLE 240CM 2.2MM RJ 4 2.8MM STD CPC STRL DISP ORNG (SURGICAL INSTRUMENTS) IMPLANT
FORCEPS BIOPSY NEEDLE 240CM 2._2MM RJ 4 2.8MM STD CPC STRL (INSTRUMENTS)
FORCEPS ENDOS 240CM 2.8MM RJ 4 JMB DISP (SURGICAL INSTRUMENTS) IMPLANT
FORCEPS ENDOS 240CM 2.8MM RJ 4_JMB DISP (INSTRUMENTS)
FORMALIN 10% BUFF 8ML_23032059 100EA/PK (CHEM)
GOWN SURG XL AAMI L3 NONREINFO_RCE HKLP CLSR STRL LTX PNK SMS (DGOW)
GOWN SURG XL L3 NONREINFORCE HKLP CLSR STRL LTX PNK SMS 47IN (DGOW) IMPLANT
INK ENDOSCOPIC MARKER SPOT 5ML GIS44 STERILE 10EA/BX (MISCELLANEOUS PT CARE ITEMS) IMPLANT
JELLY LUB EZ BCTRST H2O SOL NG_RS PKT STRL 5GM LF (WOUND CARE/ENTEROSTOMAL SUPPLY)
KIT ENDOS ENDOZIME BDSD SLR SPONGE ENZM DETERGENT 500ML (DIS) ×1 IMPLANT
KIT ENDOS ENZM BDSD SLR SPONGE_ENZM DTRG 500ML (DIS) ×1
LIGATOR 2300MM 30MM PLLP PRELD_DEV INT HNDL DISP ENDOS (INSTRUMENTS ENDOMECHANICAL)
LIGATOR DEV STRL DISP ENDOS LF (ENDOSCOPIC SUPPLIES) IMPLANT
LINER SUCT RD CRD MEDIVAC TW LOCK LID SHTOF VALVE CAN FILTER 1500CC LF  DISP (Suction) ×1 IMPLANT
LINER SUCT RD CRD MEDIVAC TW L_OCK LID SHTOF VALVE CAN FLTR (Suction) ×1
LOOP AUTOCLAV HLDR DTCH 30MM S_URG NYL PLPCTM NONST DISP (INSTRUMENTS ENDOMECHANICAL)
LOOP HLDR DTCH AUTOCLAV 30MM SURG NYL PLPCTM NONST LF  DISP (ENDOSCOPIC SUPPLIES) IMPLANT
NEEDLE ENDOS 5MM 25GA 2.5MM SS TFLN 230CM INJ PRJ SHEATH LL SPRG LD HNDL CRLK STRL LF  DISP (NEEDLES & SYRINGE SUPPLIES) IMPLANT
NEEDLE ENDOS 5MM 25GA 2.5MM SS_TFLN 230CM INJ PRJ SHTH LL (NEEDLES & SYRINGE SUPPLIES)
NEEDLE SCLRTX 25GA 2.3MM OPTC TIP SHTH STRL LF DISP YW (NEEDLES & SYRINGE SUPPLIES) IMPLANT
PROBE COAG 7.2FT 6.9FR FIAPC CRCMF PLUG PLAY FUNCTIONALITY FILTER (ENDOSCOPIC SUPPLIES) IMPLANT
PROBE ESURG 220CM 2.3MM FIAPC FLXB STR FIRE STRL DISP (CAUTERY SUPPLIES) IMPLANT
PROBE ESURG 220CM 2.3MM FIAPC_FLXB STR FIRE ARGON PLAS COAG (CAUTERY SUPPLIES)
PROBE ESURG 7.2FT 6.9FR FIAPC_CRCMF PLUG PLAY FUNCTIONALITY (INSTRUMENTS ENDOMECHANICAL)
SET EXT STR 6.2ML 42IN IV MALE LL ADPR STD BORE 2 LL ACT VALVE RETRACT COL STRL CLRLNK LF  DEHP (IV TUBING & ACCESSORIES) IMPLANT
SET EXT STR 6.2ML 42IN IV MALE_LL ADPR STD BORE 2 LL ACT (IV TUBING & ACCESSORIES)
SNARE SM OVAL 240CM 2.4MM SNS LOOP SHRTHRW FLXB ENDOS PLPCTM 13MM STRL LF  DISP (DIAGNOSTIC) IMPLANT
SNARE SM OVL 240CM 2.4MM SNS L_OOP SHRTHRW FLXB ENDOS PLPCTM (DIAGNOSTIC)
SOLIDIFY FLUID 1500ML DSPNSR L_Q TX SOLIDIFY SFTP LTS+ DISP (STER) ×2
SOLUTION IRRG H2O 500CC 2F7113 (SOLUTIONS) ×1
SYRINGE INFLAT ALN II GA STRL DISP 60ML (NEEDLES & SYRINGE SUPPLIES) IMPLANT
SYRINGE INFLAT ALN II GA STRL_DISP 60ML (NEEDLES & SYRINGE SUPPLIES)
SYRINGE LL 50ML LF  STRL GRAD N-PYRG DEHP-FR PVC FREE MED DISP CLR (NEEDLES & SYRINGE SUPPLIES) IMPLANT
SYRINGE LL 50ML LF STRL GRAD_N-PYRG DEHP-FR PVC FREE MED (NEEDLES & SYRINGE SUPPLIES)
TRAP SPEC RETR ETRAP MAGNIFY W IND MSR GUIDE PLYP LF DISP (GI LAB SUPPLIES) IMPLANT
TUBING OXYGEN 50/CS 001302 (TUBE/TUBING & SUCTION SUPPLIES)
TUBING SUCT CLR 20FT 9/32IN MEDIVAC NCDTV M/M CONN STRL LF (Suction) ×1 IMPLANT
TUBING SUCT CONN 20FT LONG_STRL N720A (Suction) ×1
WATER STRL 500ML PLASTIC PR BTL LF (SOLUTIONS) ×1 IMPLANT

## 2016-04-26 NOTE — Anesthesia Preprocedure Evaluation (Signed)
ANESTHESIA PRE-OP EVALUATION  Review of Systems         patient summary reviewed          Pulmonary     Cardiovascular           GI/Hepatic/Renal       Endo/Other         Neuro/Psych/MS    Cancer                                Physical Assessment      Patient summary reviewed   Airway       Mallampati: II    TM distance: >3 FB    Neck ROM: full  Mouth Opening: good.            Dental       Dentition intact             Pulmonary    Breath sounds clear to auscultation       Cardiovascular    Rhythm: regular  Rate: Normal       Other findings            Plan  Planned anesthesia type: MAC  ASA 2     Intravenous induction   Anesthetic plan and risks discussed with patient.     Anesthesia issues/risks discussed are: Post-op Cognitive Dysfunction, Intraoperative Awareness/ Recall, Aspiration and Cardiac Events/MI.    Use of blood products discussed with patient whom did not consent to blood products.     Patient's NPO status is appropriate for Anesthesia.         Plan discussed with CRNA.    (Pt. Understands and accepts Risks & Benefits of MAC.  Pt. Wishes to Proceed.    Anesthesia Consent Reviewed and Signed.  AQA.)

## 2016-04-26 NOTE — OR Nursing (Signed)
Abdomen remains soft and non-distended.

## 2016-04-26 NOTE — Anesthesia Postprocedure Evaluation (Signed)
Anesthesia Post Op Evaluation    Patient: Natasha Perez  Procedure(s) Performed:Procedure(s):  COLONOSCOPY  Last Vitals:Temperature: 36.4 C (97.5 F) (04/26/16 1455)  Heart Rate: 79 (04/26/16 1455)  BP (Non-Invasive): 118/82 (04/26/16 1455)  Respiratory Rate: 19 (04/26/16 1455)  SpO2-1: 98 % (04/26/16 1304)  Patient is sufficiently recovered from the effects of anesthesia to participate in the evaluation and has returned to their pre-procedure level.  Patient location during evaluation: PACU   Post-procedure handoff checklist completed    Patient participation: complete - patient participated  Level of consciousness: awake and alert and responsive to verbal stimuli  Pain management: adequate  Airway patency: patent  Anesthetic complications: no  Cardiovascular status: acceptable  Respiratory status: acceptable  Hydration status: acceptable  Patient post-procedure temperature: Pt Normothermic   PONV Status: Absent

## 2016-04-26 NOTE — Discharge Instructions (Signed)
SURGICAL DISCHARGE INSTRUCTIONS     Dr. Kupec, Justin T., MD  performed your COLONOSCOPY today at the [facility]    [facility]:  Monday through Friday from 6 a.m. - 7 p.m.: [phone]  Between 7 p.m. - 6 a.m., weekends and holidays:  Call [facility] at [phone] or [phone].    SIGNS AND SYMPTOMS OF A WOUND / INCISION INFECTION   Be sure to watch for the following:   Increase in redness or red streaks near or around the wound or incision.   Increase in pain that is intense or severe and cannot be relieved by the pain medication that your doctor has given you.   Increase in swelling that cannot be relieved by elevation of a body part, or by applying ice, if permitted.   Increase in drainage, or if yellow / green in color and smells bad. This could be on a dressing or a cast.   Increase in fever for longer than 24 hours, or an increase that is higher than 101 degrees Fahrenheit (normal body temperature is 98 degrees Fahrenheit). The incision may feel warm to the touch.    **CALL YOUR DOCTOR IF ONE OR MORE OF THESE SIGNS / SYMPTOMS SHOULD OCCUR.    ANESTHESIA INFORMATION   ANESTHESIA -- ADULT PATIENTS:  You have received intravenous sedation / general anesthesia, and you may feel drowsy and light-headed for several hours. You may even experience some forgetfulness of the procedure. DO NOT DRIVE A MOTOR VEHICLE or perform any activity requiring complete alertness or coordination until you feel fully awake in about 24-48 hours. Do not drink alcoholic beverages for at least 24 hours. Do not stay alone, you must have a responsible adult available to be with you. You may also experience a dry mouth or nausea for 24 hours. This is a normal side effect and will disappear as the effects of the medication wear off.    REMEMBER   If you experience any difficulty breathing, chest pain, bleeding that you feel is excessive, persistent nausea or vomiting or for any other concerns:  Call  your physician Dr. Kupec at [phone] or [phone]. You may also ask to have the GI doctor on call paged. They are available to you 24 hours a day.    SPECIAL INSTRUCTIONS / COMMENTS     Instructions for Lower Endoscopy  Patient Information:  No driving until tomorrow, You may experience a small amount of blood in your stool if a biopsy has been taken  and Call in one week for biopsy results - GI Office [phone].  REMEMBER TO:  Call your physician for any of the following symptoms:  1.) Persistent vomiting or vomiting bright red blood  2.) Fever (temperature greater than 100F)  3.) Severe abdominal pain or rigid bloated abdomen.  4.) Large amounts of bright red blood or maroon stools.    FOLLOW-UP APPOINTMENTS   Please call patient services at [phone] or [phone] to schedule a date / time of return. They are open Monday - Friday from 7:30 am - 5:00 pm.

## 2016-04-26 NOTE — OR PostOp (Signed)
After Visit Summary (AVS) and education hand-outs were given to patient and family after instructional information was reviewed & understood. Patient discharged with belongings via wheelchair by J. Debbe Bales, [place].

## 2016-04-26 NOTE — Anesthesia Transfer of Care (Signed)
ANESTHESIA TRANSFER OF CARE   Natasha Perez is a 45 y.o. ,female, Weight: 103.1 kg (227 lb 4.7 oz)   had Procedure(s):  COLONOSCOPY  performed  04/26/16   Primary Service: Sharyn Creamer, MD    Past Medical History:   Diagnosis Date    Cancer [facility])     Ectopic pregnancy     2005    Melanoma ([place]) 1999    right thigh, had 2 or 3 surgeries at [facility], sampled lymph nodes which were ok, still sees dermatologist yearly in [place] History as of 04/26/16     SULFA (SULFONAMIDES)       Noted Status Severity Type Reaction    05/27/10 1117 Alinda Sierras R 05/27/10 Active   Nausea/ Vomiting          IODINE AND IODIDE CONTAINING PRODUCTS       Noted Status Severity Type Reaction    05/27/10 1118 Liggett, Kelli R 05/27/10 Active  Topical Itching, Swelling, Hives/ Urticaria              I completed my transfer of care / handoff to the receiving personnel during which we discussed:  All key/critical aspects of case discussed, Expectation of post procedure, Fluids/Product and Gave opportunity for questions and acknowledgement of understanding                                                                Last OR Temp: Temperature: 36.4 C (97.5 F)  ABG:  POTASSIUM   Date Value Ref Range Status   04/18/2016 4.0 3.5 - 5.1 mmol/L Final     CALCIUM   Date Value Ref Range Status   04/18/2016 9.8 8.5 - 10.2 mg/dL Final     Airway:   Blood pressure 118/82, pulse 79, temperature 36.4 C (97.5 F), resp. rate 19, height 1.778 m (5\' 10" ), weight 103.1 kg (227 lb 4.7 oz), last menstrual period 04/09/2016, SpO2 98 %, not currently breastfeeding.

## 2016-04-26 NOTE — OR Nursing (Signed)
Abdomen soft and non-tender.  Patient denies any discomfort.  Patient very nervous.  Reassurance given.

## 2016-04-26 NOTE — H&P (Signed)
[facility]  Pre-Anesthesia Brief History and Physical      Natasha Perez   MRN:  [id number]  Date of Birth:  02-18-1971    Date of Procedure:  04/26/2016    Chief Complaint: Rectal Bleeding    HPI: Natasha Perez, Natasha Perez is a 45 y.o. year old female who presents today for Colonoscopy.   This is the patient's 1st colonoscopy..    This procedure is being done to evaluate Rectal Bleeding.    The patient denies abdominal pain, melena, unintentional weight loss or family history of GI cancer.        Past Medical History:   Diagnosis Date    Cancer [facility])     Ectopic pregnancy     2005    Melanoma ([place]) 1999    right thigh, had 2 or 3 surgeries at [facility], sampled lymph nodes which were ok, still sees dermatologist yearly in [place]   Allergen Reactions    Iodine And Iodide Containing Products Itching, Swelling and Hives/ Urticaria    Sulfa (Sulfonamides) Nausea/ Vomiting       Medications Prior to Admission     Prescriptions    Cholecalciferol, Vitamin D3, 50,000 unit Oral Capsule    One weekly for 3 months    Diclofenac Epolamine 1.3 % Transdermal Patch 12 hr    180 mg by Transdermal route Twice daily Please disregard prescription for diclofenac gel, use this instead    ergocalciferol, vitamin D2, (VITAMIN D) 50,000 unit Oral Capsule    Take 1 Cap (50,000 Units total) by mouth Every 7 days    Ibuprofen (MOTRIN) 800 mg Oral Tablet    Take 1 Tab (800 mg total) by mouth Three times a day as needed for Pain    multivitamin Tab    take 1 Tab by mouth Once a day.    ondansetron (ZOFRAN ODT) 4 mg Oral Tablet, Rapid Dissolve    1 Tab (4 mg total) by Sublingual route Every 8 hours as needed for nausea/vomiting    scopolamine (TRANSDERM SCOP) 1.5 mg Transdermal Patch 72 hr    1 Patch (1.5 mg total) by Transdermal route Every 72 hours    valACYclovir (VALTREX) 500 mg Oral Tablet    Take 1 Tab (500 mg total) by mouth Twice daily BRAND ONLY           Past Surgical History:   Procedure Laterality Date     HX BREAST BIOPSY Right     needle bx.  age  66    benign    HX MYOMECTOMY  2005    HX OTHER      3 right leg surgeries for melanoma    HX REFRACTIVE SURGERY  2000           Physical Exam:  VS: BP (!) 127/102   Pulse 65   Temp 36.8 C (98.2 F)   Resp 20   Ht 1.778 m (5\' 10" )   Wt 103.1 kg (227 lb 4.7 oz)   LMP 04/09/2016 (Exact Date)   SpO2 98%   BMI 32.61 kg/m2  General: Stable appearing, age-appropriate 45 y.o. female in no acute distress.  Eyes: Sclera not icteric, no erythema.  HENT: Normocephalic/atraumatic,  moist mucous membranes.  Lungs: Breathing comfortably, no cough or wheeze.  Abdomen: Soft, nontender, nondistended.  Skin: No jaundice, no rash.  Psych: Alert and oriented, responds appropriately, pleasant.      Assessment:  Rectal Bleeding    Plan:  Proceed with Colonoscopy.     Joya San, MD  Fellow, Section of [place]  Pager: [phone]    =====================================================  04/26/2016    I have seen and examined the patient.  I reviewed the fellow's note.  I agree with the findings and plan of care as documented in the fellow's note.  Any exceptions/additions are edited/noted.    Rectal bleeding    Sharyn Creamer, MD, [facility]   04/26/2016, 14:01  Assistant Professor of Medicine  Section of [place] / [place]

## 2016-04-26 NOTE — Care Plan (Signed)
Problem: Patient Care Overview (Adult,OB)  Goal: Plan of Care Review(Adult,OB)  The patient and/or their representative will communicate an understanding of their plan of care   Outcome: Ongoing (see interventions/notes)      Problem: Anxiety (Adult)  Goal: Identify Related Risk Factors and Signs and Symptoms  Related risk factors and signs and symptoms are identified upon initiation of Human Response Clinical Practice Guideline (CPG).   Outcome: Ongoing (see interventions/notes)      Problem: Thought Process Alteration (Adult)  Goal: Identify Related Risk Factors and Signs and Symptoms  Related risk factors and signs and symptoms are identified upon initiation of Human Response Clinical Practice Guideline (CPG).   Outcome: Ongoing (see interventions/notes)      Problem: Perioperative Period (Adult)  Prevent and manage potential problems including:1. bleeding2. gastrointestinal complications3. hypothermia4. infection5. pain6. perioperative injury7. respiratory compromise8. situational response9. urinary retention10. venous thromboembolism11. wound complications   Goal: Signs and Symptoms of Listed Potential Problems Will be Absent or Manageable (Perioperative Period)  Signs and symptoms of listed potential problems will be absent or manageable by discharge/transition of care (reference Perioperative Period (Adult) CPG).   Outcome: Ongoing (see interventions/notes)

## 2016-07-26 NOTE — Addendum Note (Signed)
Addendum  created 07/26/16 [id number] by Freddy Finner, CRNA    Anesthesia Event edited

## 2016-08-08 ENCOUNTER — Encounter (HOSPITAL_BASED_OUTPATIENT_CLINIC_OR_DEPARTMENT_OTHER): Payer: BLUE CROSS/BLUE SHIELD | Admitting: Internal Medicine

## 2016-08-14 ENCOUNTER — Encounter (HOSPITAL_BASED_OUTPATIENT_CLINIC_OR_DEPARTMENT_OTHER): Payer: Self-pay | Admitting: Internal Medicine

## 2016-08-26 ENCOUNTER — Other Ambulatory Visit: Payer: Self-pay

## 2017-06-29 ENCOUNTER — Encounter (HOSPITAL_BASED_OUTPATIENT_CLINIC_OR_DEPARTMENT_OTHER): Payer: Self-pay | Admitting: Internal Medicine

## 2018-12-09 ENCOUNTER — Emergency Department (INDEPENDENT_AMBULATORY_CARE_PROVIDER_SITE_OTHER)
Admission: EM | Admit: 2018-12-09 | Discharge: 2018-12-09 | Disposition: A | Discharge status: Home / Self Care | Payer: Worker's Compensation | Source: Home / Self Care

## 2018-12-09 ENCOUNTER — Other Ambulatory Visit: Payer: Self-pay

## 2018-12-09 ENCOUNTER — Emergency Department (INDEPENDENT_AMBULATORY_CARE_PROVIDER_SITE_OTHER): Payer: Worker's Compensation

## 2018-12-09 ENCOUNTER — Encounter: Payer: Self-pay | Admitting: *Deleted

## 2018-12-09 DIAGNOSIS — W19XXXA Unspecified fall, initial encounter: Secondary | ICD-10-CM | POA: Diagnosis not present

## 2018-12-09 DIAGNOSIS — S2231XA Fracture of one rib, right side, initial encounter for closed fracture: Secondary | ICD-10-CM

## 2018-12-09 DIAGNOSIS — M25519 Pain in unspecified shoulder: Secondary | ICD-10-CM | POA: Diagnosis not present

## 2018-12-09 DIAGNOSIS — M25522 Pain in left elbow: Secondary | ICD-10-CM | POA: Diagnosis not present

## 2018-12-09 DIAGNOSIS — M549 Dorsalgia, unspecified: Secondary | ICD-10-CM

## 2018-12-09 DIAGNOSIS — M25512 Pain in left shoulder: Secondary | ICD-10-CM | POA: Diagnosis not present

## 2018-12-09 MED ORDER — TRAMADOL HCL 50 MG PO TABS: 50.0000 mg | ORAL_TABLET | Freq: Four times a day (QID) | ORAL | 0 refills | Status: AC | PRN

## 2018-12-09 MED ORDER — CYCLOBENZAPRINE HCL 5 MG PO TABS: 5.0000 mg | ORAL_TABLET | Freq: Two times a day (BID) | ORAL | 0 refills | Status: AC | PRN

## 2018-12-09 NOTE — ED Triage Notes (Signed)
Pt c/o LT arm, RT ribs and LT upper back pain post fall at work on 12/02/18. She has taken IBF and applied ice.

## 2018-12-09 NOTE — ED Provider Notes (Signed)
[facility]    CSN: [id number] Arrival date & time: 12/09/18  1803     History   Chief Complaint Chief Complaint  Patient presents with  . Arm Pain  . Rib Injury  . Back Pain    HPI Sylvia PaneBridget Mesta is a 48 y.o. female.   HPI  Sylvia Burch is a 48 y.o. female presenting to UC with worker's comp injury c/o pain between her shoulder blades, Left shoulder, Left elbow and Right rib pain that occurred after a fall on 12/02/2018. 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 She believes she hit her Right rib on the bed railing. Pain is gradually worsening since onset. Worse with certain movements. She has tried ice and "maximum strength" ibuprofen with mild relief. Pt notes she uses her Left arm to perform her sonograms, which causes more pain in the Left arm.  Denies numbness or tingling.    History reviewed. No pertinent past medical history.  There are no active problems to display for this patient.   Past Surgical History:  Procedure Laterality Date  . pt declined to tell about her surgeries      OB History   No obstetric history on file.      Home Medications    Prior to Admission medications   Medication Sig Start Date End Date Taking? Authorizing Provider  cyclobenzaprine (FLEXERIL) 5 MG tablet Take 1-2 tablets (5-10 mg total) by mouth 2 (two) times daily as needed for muscle spasms. 12/09/18   Lurene ShadowPhelps, Ferrah Panagopoulos O, PA-C  traMADol (ULTRAM) 50 MG tablet Take 1 tablet (50 mg total) by mouth every 6 (six) hours as needed. 12/09/18   Lurene ShadowPhelps, Landry Lookingbill O, PA-C    Family History Family History  Problem Relation Age of Onset  . Diabetes Mother   . Hypertension Mother     Social History Social History   Tobacco Use  . Smoking status: Never Smoker  . Smokeless tobacco: Never Used  Substance Use Topics  . Alcohol use:  Never    Frequency: Never  . Drug use: Never     Allergies   Sulfa antibiotics   Review of Systems Review of Systems  Musculoskeletal: Positive for arthralgias, back pain, joint swelling and myalgias. Negative for neck pain and neck stiffness.  Skin: Negative for color change and wound.  Neurological: Positive for weakness (Left arm due to pain). Negative for numbness.     Physical Exam Triage Vital Signs ED Triage Vitals  Enc Vitals Group     BP 12/09/18 1853 (!) 137/96     Pulse Rate 12/09/18 1853 68     Resp 12/09/18 1853 18     Temp 12/09/18 1853 97.6 F (36.4 C)     Temp Source 12/09/18 1853 Oral     SpO2 12/09/18 1853 100 %     Weight 12/09/18 1854 247 lb (112 kg)     Height 12/09/18 1854 5\' 11"  (1.803 m)     Head Circumference --      Peak Flow --      Pain Score 12/09/18 1854 8     Pain Loc --      Pain Edu? --      Excl. in GC? --    No data found.  Updated Vital Signs BP (!) 137/96 (BP Location: Right Arm)  Pulse 68   Temp 97.6 F (36.4 C) (Oral)   Resp 18   Ht 5\' 11"  (1.803 m)   Wt 247 lb (112 kg)   LMP 11/20/2018   SpO2 100%   BMI 34.45 kg/m   Visual Acuity Right Eye Distance:   Left Eye Distance:   Bilateral Distance:    Right Eye Near:   Left Eye Near:    Bilateral Near:     Physical Exam Vitals signs and nursing note reviewed.  Constitutional:      Appearance: She is well-developed.  HENT:     Head: Normocephalic and atraumatic.  Neck:     Musculoskeletal: Normal range of motion.  Cardiovascular:     Rate and Rhythm: Normal rate and regular rhythm.  Pulmonary:     Effort: Pulmonary effort is normal.     Breath sounds: Normal breath sounds.  Chest:     Chest wall: Tenderness (Right mid axial rib tenderness. no deformity or crepitus) present.  Musculoskeletal: Normal range of motion.        General: Tenderness present.     Comments: Left arm: full ROM with increased pain on end range.  Diffuse shoulder and elbow tenderness.  No crepitus or obvious edema.  Full ROM wrist, non-tender. No midline spinal tenderness. Tenderness to bilateral rhomboid muscle region.  Skin:    General: Skin is warm and dry.  Neurological:     General: No focal deficit present.     Mental Status: She is alert and oriented to person, place, and time.  Psychiatric:        Behavior: Behavior normal.      UC Treatments / Results  Labs (all labs ordered are listed, but only abnormal results are displayed) Labs Reviewed - No data to display  EKG None  Radiology Dg Ribs Unilateral W/chest Right  Result Date: 12/09/2018 CLINICAL DATA:  Fall, right rib pain EXAM: RIGHT RIBS AND CHEST - 3+ VIEW COMPARISON:  None. FINDINGS: Fracture through the lateral right 9th rib. Lungs are clear. No effusion or pneumothorax. Heart is normal size. IMPRESSION: Nondisplaced right lateral 9th rib fracture. No associated effusion or pneumothorax. Electronically Signed   By: Charlett Nose M.D.   On: 12/09/2018 19:51   Dg Elbow Complete Left  Result Date: 12/09/2018 CLINICAL DATA:  Fall last Monday EXAM: LEFT ELBOW - COMPLETE 3+ VIEW COMPARISON:  None. FINDINGS: No acute fracture. No dislocation.  Unremarkable soft tissues. IMPRESSION: No acute bony pathology. Electronically Signed   By: Jolaine Click M.D.   On: 12/09/2018 19:50   Dg Shoulder Left  Result Date: 12/09/2018 CLINICAL DATA:  Fall EXAM: LEFT SHOULDER - 2+ VIEW COMPARISON:  None. FINDINGS: No acute fracture. No dislocation.  Unremarkable soft tissues. IMPRESSION: No acute bony pathology. Electronically Signed   By: Jolaine Click M.D.   On: 12/09/2018 19:49    Procedures Procedures (including critical care time)  Medications Ordered in UC Medications - No data to display  Initial Impression / Assessment and Plan / UC Course  I have reviewed the triage vital signs and the nursing notes.  Pertinent labs & imaging results that were available during my care of the patient were reviewed by me  and considered in my medical decision making (see chart for details).     Reviewed imaging with pt Pain medication prescribed for rib fracture and arm pain Encouraged f/u with her HR department and Employee/Occupational Health for guidance to return to work w/o restriction   Final  Clinical Impressions(s) / UC Diagnoses   Final diagnoses:  Shoulder pain  Closed traumatic nondisplaced fracture of one rib of right side, initial encounter  Left elbow pain  Upper back pain     Discharge Instructions      You may take 500mg  acetaminophen every 4-6 hours or in combination with ibuprofen 400-600mg  every 6-8 hours as needed for pain and inflammation.  Tramadol is strong pain medication. While taking, do not drink alcohol, drive, or perform any other activities that requires focus while taking these medications.   Please call Employee Health at [facility] as well as your HR department for further guidance on follow up care for your work related injury.      ED Prescriptions    Medication Sig Dispense Auth. Provider   traMADol (ULTRAM) 50 MG tablet Take 1 tablet (50 mg total) by mouth every 6 (six) hours as needed. 15 tablet Doroteo Glassman, Judeen Geralds O, PA-C   cyclobenzaprine (FLEXERIL) 5 MG tablet Take 1-2 tablets (5-10 mg total) by mouth 2 (two) times daily as needed for muscle spasms. 30 tablet Lurene Shadow, PA-C     Controlled Substance Prescriptions [place] Controlled Substance Registry consulted? Yes, I have consulted the [place] Controlled Substances Registry for this patient, and feel the risk/benefit ratio today is favorable for proceeding with this prescription for a controlled substance.   Lurene Shadow, PA-C 12/10/18 0830

## 2018-12-09 NOTE — Discharge Instructions (Signed)
°  You may take 500mg  acetaminophen every 4-6 hours or in combination with ibuprofen 400-600mg  every 6-8 hours as needed for pain and inflammation.  Tramadol is strong pain medication. While taking, do not drink alcohol, drive, or perform any other activities that requires focus while taking these medications.   Please call Employee Health at [facility] as well as your HR department for further guidance on follow up care for your work related injury.

## 2019-01-07 ENCOUNTER — Other Ambulatory Visit (HOSPITAL_COMMUNITY): Payer: Self-pay

## 2019-01-07 ENCOUNTER — Other Ambulatory Visit (HOSPITAL_COMMUNITY): Payer: Self-pay | Admitting: PHYSICIAN ASSISTANT

## 2019-01-07 DIAGNOSIS — M25522 Pain in left elbow: Secondary | ICD-10-CM

## 2019-01-07 DIAGNOSIS — M25519 Pain in unspecified shoulder: Secondary | ICD-10-CM

## 2019-01-11 ENCOUNTER — Ambulatory Visit
Admission: RE | Admit: 2019-01-11 | Discharge: 2019-01-11 | Disposition: A | Discharge status: Home / Self Care | Payer: Worker's Comp, Other unspecified | Source: Ambulatory Visit | Attending: PHYSICIAN ASSISTANT | Admitting: PHYSICIAN ASSISTANT

## 2019-01-11 ENCOUNTER — Other Ambulatory Visit: Payer: Self-pay

## 2019-01-11 ENCOUNTER — Ambulatory Visit (HOSPITAL_COMMUNITY)
Admission: RE | Admit: 2019-01-11 | Discharge: 2019-01-11 | Disposition: A | Discharge status: Home / Self Care | Payer: Worker's Comp, Other unspecified | Source: Ambulatory Visit

## 2019-01-11 DIAGNOSIS — M25519 Pain in unspecified shoulder: Secondary | ICD-10-CM

## 2019-01-11 DIAGNOSIS — M25512 Pain in left shoulder: Secondary | ICD-10-CM | POA: Insufficient documentation

## 2019-01-11 DIAGNOSIS — M7552 Bursitis of left shoulder: Secondary | ICD-10-CM | POA: Insufficient documentation

## 2019-01-11 DIAGNOSIS — M25522 Pain in left elbow: Secondary | ICD-10-CM | POA: Insufficient documentation

## 2019-01-11 DIAGNOSIS — M19012 Primary osteoarthritis, left shoulder: Secondary | ICD-10-CM | POA: Insufficient documentation

## 2019-01-11 DIAGNOSIS — M75102 Unspecified rotator cuff tear or rupture of left shoulder, not specified as traumatic: Secondary | ICD-10-CM | POA: Insufficient documentation

## 2019-01-13 ENCOUNTER — Other Ambulatory Visit (HOSPITAL_COMMUNITY): Payer: Self-pay | Admitting: Radiology

## 2019-01-15 ENCOUNTER — Other Ambulatory Visit (HOSPITAL_COMMUNITY): Payer: Self-pay | Admitting: PHYSICIAN ASSISTANT

## 2019-01-15 DIAGNOSIS — M25512 Pain in left shoulder: Secondary | ICD-10-CM

## 2019-01-18 ENCOUNTER — Ambulatory Visit
Admission: RE | Admit: 2019-01-18 | Discharge: 2019-01-18 | Disposition: A | Discharge status: Home / Self Care | Payer: Worker's Comp, Other unspecified | Source: Ambulatory Visit | Attending: PHYSICIAN ASSISTANT | Admitting: PHYSICIAN ASSISTANT

## 2019-01-18 ENCOUNTER — Other Ambulatory Visit: Payer: Self-pay

## 2019-01-18 DIAGNOSIS — M25512 Pain in left shoulder: Secondary | ICD-10-CM | POA: Insufficient documentation

## 2019-02-27 ENCOUNTER — Ambulatory Visit (HOSPITAL_BASED_OUTPATIENT_CLINIC_OR_DEPARTMENT_OTHER): Payer: Self-pay | Admitting: Sports Medicine

## 2019-03-03 ENCOUNTER — Other Ambulatory Visit (HOSPITAL_BASED_OUTPATIENT_CLINIC_OR_DEPARTMENT_OTHER): Payer: Self-pay | Admitting: Sports Medicine

## 2019-03-03 DIAGNOSIS — M25512 Pain in left shoulder: Secondary | ICD-10-CM

## 2019-03-03 DIAGNOSIS — M25522 Pain in left elbow: Secondary | ICD-10-CM

## 2019-03-04 ENCOUNTER — Ambulatory Visit (HOSPITAL_BASED_OUTPATIENT_CLINIC_OR_DEPARTMENT_OTHER)
Admission: RE | Admit: 2019-03-04 | Discharge: 2019-03-04 | Disposition: A | Discharge status: Home / Self Care | Payer: Worker's Comp, Other unspecified | Source: Ambulatory Visit | Admitting: Radiology

## 2019-03-04 ENCOUNTER — Other Ambulatory Visit: Payer: Self-pay

## 2019-03-04 ENCOUNTER — Ambulatory Visit
Admission: RE | Admit: 2019-03-04 | Discharge: 2019-03-04 | Disposition: A | Discharge status: Home / Self Care | Payer: Worker's Comp, Other unspecified | Source: Ambulatory Visit | Attending: Sports Medicine | Admitting: Sports Medicine

## 2019-03-04 ENCOUNTER — Encounter (HOSPITAL_BASED_OUTPATIENT_CLINIC_OR_DEPARTMENT_OTHER): Payer: Self-pay | Admitting: Sports Medicine

## 2019-03-04 ENCOUNTER — Ambulatory Visit (INDEPENDENT_AMBULATORY_CARE_PROVIDER_SITE_OTHER): Payer: Worker's Comp, Other unspecified | Admitting: Sports Medicine

## 2019-03-04 VITALS — Ht 70.0 in | Wt 233.0 lb

## 2019-03-04 DIAGNOSIS — M19012 Primary osteoarthritis, left shoulder: Secondary | ICD-10-CM | POA: Insufficient documentation

## 2019-03-04 DIAGNOSIS — M7712 Lateral epicondylitis, left elbow: Secondary | ICD-10-CM | POA: Insufficient documentation

## 2019-03-04 DIAGNOSIS — M25512 Pain in left shoulder: Secondary | ICD-10-CM | POA: Insufficient documentation

## 2019-03-04 DIAGNOSIS — M7582 Other shoulder lesions, left shoulder: Secondary | ICD-10-CM

## 2019-03-04 DIAGNOSIS — M25522 Pain in left elbow: Secondary | ICD-10-CM | POA: Insufficient documentation

## 2019-03-05 MED ORDER — MELOXICAM 15 MG TABLET
15.0000 mg | ORAL_TABLET | Freq: Every day | ORAL | 2 refills | Status: DC
Start: 2019-03-05 — End: 2019-07-17

## 2019-03-05 NOTE — Progress Notes (Signed)
[facility], MEDICAL OFFICE BUILDING  [place] [address]      NAME: Natasha Perez  DATE: 03/04/2019  DOB: 09/08/1971  MRN: [id number]      CHIEF COMPLAINT   Elbow Pain (left; ) and Shoulder Pain (left; )    HPI:  Natasha Perez is a 48 y.o. female who presents today for left shoulder pain and left elbow pain.  Patient states on December 02, 2018 he fell in a patient's room and tried to catch herself on bed rails which resulted in left shoulder pain and left elbow pain and rib pain on the left side.  She complains of constant sharp and aching pain rated 9 out 10 in clinic today.  She states pain is increased with use of her left arm.  This is a worker's Comp case.  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  She is currently taking ibuprofen as needed for pain as well as applying ice.    PAST MEDICAL HISTORY:  Patient Active Problem List   Diagnosis   . Melanoma (CMS [place])   . Fibroid   . Back pain   . Vitamin D deficiency   . Infertility management   . History of ectopic pregnancy    Reviewed  PAST SURGICAL HISTORY:  Past Surgical History:   Procedure Laterality Date   . HX BREAST BIOPSY Right     needle bx.  age  53    benign   . HX MYOMECTOMY  2005   . HX OTHER      3 right leg surgeries for melanoma   . HX REFRACTIVE SURGERY  2000        Reviewed   CURRENT MEDICATIONS:   Current Outpatient Medications   Medication Sig Dispense Refill   . Diclofenac Epolamine 1.3 % Transdermal Patch 12 hr 180 mg by Transdermal route Twice daily Please disregard prescription for diclofenac gel, use this instead 30 Patch 3   . Ibuprofen (MOTRIN) 800 mg Oral Tablet Take 1 Tab (800 mg total) by mouth Three times a day as needed for Pain 270 Tab 3   . meloxicam (MOBIC) 15 mg Oral Tablet Take 1 Tab (15 mg total) by mouth Once a day 30 Tab 2   . multivitamin Tab take 1 Tab by mouth Once a day.  0     No current facility-administered medications for this visit.     Reviewed   ALLERGIES:  Iodine  and iodide containing products and Sulfa (sulfonamides) Reviewed   SOCIAL HISTORY:   Social History     Socioeconomic History   . Marital status: Single     Spouse name: Not on file   . Number of children: 0   . Years of education: 18+   . Highest education level: Not on file   Occupational History   . Occupation: [employer]   Tobacco Use   . Smoking status: Never Smoker   . Smokeless tobacco: Never Used   Substance and Sexual Activity   . Alcohol use: No     Alcohol/week: 0.0 standard drinks     Comment: rare   . Drug use: No   . Sexual activity: Yes     Partners: Male     Birth control/protection: None     Comment: Partner for 12 years   Other Topics Concern   . Abuse/Domestic Violence No   . Breast Self Exam Yes   .  Caffeine Concern No   . Calcium intake adequate Yes   . Exercise Concern No     Comment: Tries to walk or bike.  Not getting as much exercise as she thinks she should.   Marland Kitchen Special Diet No     Comment: Only eating one meal a day now. Dinner is hit or miss.  Needs to eat more regularly she says.    . Ability to Walk 1 Flight of Steps without SOB/CP Yes   . Routine Exercise No   . Ability to Walk 2 Flight of Steps without SOB/CP Yes   . Unable to Ambulate No   . Total Care No   . Ability To Do Own ADL's Yes   . Uses Walker No   . Other Activity Level Yes   . Uses Cane No   Social History Narrative    Lives in [place].  No children. 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  Was an only child.  Got undergraduate degree at [employer] and a math degree at [employer].     Reviewed   FAMILY HISTORY:  Family Medical History:     Problem Relation (Age of Onset)    Cancer Other    Diabetes Mother    Hypertension (High Blood Pressure) Mother    Lung Cancer Maternal Grandmother    Stroke Mother (28)           Reviewed     ROS  Left shoulder pain  Left elbow pain   - all others were reviewed and negative    PHYSICAL EXAM  Ht 1.778  m (5\' 10" )   Wt 106 kg (233 lb)   BMI 33.43 kg/m             GENERAL EXAM    Constitutional: She is oriented to person, place, and time. She appears well-developed and well-nourished.   HENT:   Head: Normocephalic and atraumatic.   Eyes: Conjunctivae are normal. Pupils are equal, round.   Neck: Normal range of motion.   Cardiovascular: Normal rate.    Pulmonary/Chest: Effort normal.   Abdominal: Non-protuberant   Neurological: She is alert and oriented to person, place, and time.   Skin: Skin is warm and dry.   Psychiatric: She has a normal mood and affect. Her behavior is normal. Judgment and thought content normal.     Nursing note and vitals reviewed.    ORTHO EXAM   P ROM she is painful at shoulder height and higher.    Range of motion is limited to below shoulder.    Strength is 4-out of 5.   Pain with palpation at the lateral epicondyle  Normal range of motion of the left elbow  Skin is normal  Neuro is intact  Brisk cap refill  Distal radial pulse is strong    IMAGING     Natasha Perez    PROCEDURE DESCRIPTION: XR SHOULDER LEFT    CLINICAL INDICATION: M25.512: Left shoulder pain    TECHNIQUE: 4 views / 4 images submitted.    There is mild acromioclavicular joint degenerative osteoarthrosis.  Glenohumeral joint spacing and alignment appear normal. There is no  abnormal high riding of the humeral head. No evidence of fracture.    IMPRESSION:  1. No fracture or malalignment.  2. Mild acromioclavicular joint degenerative osteoarthrosis.      Natasha Perez    PROCEDURE DESCRIPTION: XR ELBOW LEFT  CLINICAL INDICATION: M25.522: Left elbow pain    TECHNIQUE: 4 views / 4 images submitted.    COMPARISON: No prior      FINDINGS: There is no acute fracture, malalignment nor bone erosion. There  are degenerative changes. There is no elbow joint effusion.    IMPRESSION:  No significant osseous abnormality left elbow        Radiologist location ID: [place]    1. Lateral epicondylitis of left elbow M77.12 Refer to External Physical Therapy     meloxicam (MOBIC) 15 mg Oral Tablet   2. Rotator cuff tendonitis, left M75.82 Refer to External Physical Therapy     meloxicam (MOBIC) 15 mg Oral Tablet           PLAN   Upon evaluation today, we have discussed normal finding of on MRI of the left elbow and minimal rotator cuff irritation of the left shoulder.  We have discussed the nature of lateral epicondylitis and rotator cuff tendinitis.  I have provided an order for physical therapy today as well as discussed the use of meloxicam 15 mg to be taken once daily.  I placed restrictions of no lifting more than 5 lb.  I have also requested for the patient to have the availability with work to be able to attend physical therapy 3-4 days per week if requested by her physical therapist.  She will follow up in 8 weeks for re-evaluation of healing progression.  While in clinic the patient stated concerned about losing her job with these restrictions.  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  A letter was provided with the current restrictions.  She will follow up in 8 weeks.        The patient states understanding and is in agreement with this plan.  Orders Placed This Encounter   . Refer to External Physical Therapy   . meloxicam (MOBIC) 15 mg Oral Tablet       The patient was advised to call with any questions, concerns or worsening symptoms.    This note was partially generated using MModal Fluency Direct system, and there may be some incorrect words, spellings, and punctuation that were not noted in checking the note before saving.    Hadley Pen, MA  I am scribing for, and in the presence of, Dr. Jaci Carrel for services provided on 03/04/2019.  Hadley Pen, MA       Note was read.  Assessment and plan is correct.  Any changes were noted   JJF

## 2019-04-29 ENCOUNTER — Encounter (HOSPITAL_BASED_OUTPATIENT_CLINIC_OR_DEPARTMENT_OTHER): Payer: Self-pay | Admitting: Sports Medicine

## 2019-04-29 ENCOUNTER — Ambulatory Visit: Payer: Worker's Comp, Other unspecified | Attending: Sports Medicine | Admitting: Sports Medicine

## 2019-04-29 ENCOUNTER — Other Ambulatory Visit: Payer: Self-pay

## 2019-04-29 VITALS — Ht 70.0 in | Wt 233.0 lb

## 2019-04-29 DIAGNOSIS — M7712 Lateral epicondylitis, left elbow: Secondary | ICD-10-CM

## 2019-04-29 DIAGNOSIS — M7582 Other shoulder lesions, left shoulder: Secondary | ICD-10-CM

## 2019-04-29 DIAGNOSIS — M546 Pain in thoracic spine: Secondary | ICD-10-CM

## 2019-04-29 DIAGNOSIS — M542 Cervicalgia: Secondary | ICD-10-CM

## 2019-04-29 MED ORDER — IBUPROFEN 600 MG TABLET
600.00 mg | ORAL_TABLET | Freq: Three times a day (TID) | ORAL | 1 refills | Status: DC | PRN
Start: 2019-04-29 — End: 2020-10-04

## 2019-04-30 NOTE — Progress Notes (Signed)
[facility], MEDICAL OFFICE BUILDING  [place] [address]      NAME: Natasha Perez  DATE: 04/29/2019  DOB: 01/22/1971  MRN: [id number]      CHIEF COMPLAINT   Follow Up (left shoulder rotator cuff tendonitis and left lateral epicondylitis )    HPI:  Natasha Perez is a 48 y.o. female who presents today for follow-up for left shoulder rotator cuff tendinitis and left lateral epicondylitis.  She complains of intermittent sharp and aching pain rated 2 for her elbow and 5 under shoulder.  She presents in KT tape present for the left elbow to treat epicondylitis.  She also states that she has tried coupling and dry needling.  She states that she discontinue the use of Mobic due to side effects.  She is currently taking ibuprofen as needed for pain.  She states compliance with physical therapy.  She now has complaints of pain going into her upper back and thoracic spine.    PAST MEDICAL HISTORY:  Patient Active Problem List   Diagnosis   . Melanoma (CMS [place])   . Fibroid   . Back pain   . Vitamin D deficiency   . Infertility management   . History of ectopic pregnancy    Reviewed  PAST SURGICAL HISTORY:  Past Surgical History:   Procedure Laterality Date   . HX BREAST BIOPSY Right     needle bx.  age  69    benign   . HX MYOMECTOMY  2005   . HX OTHER      3 right leg surgeries for melanoma   . HX REFRACTIVE SURGERY  2000        Reviewed   CURRENT MEDICATIONS:   Current Outpatient Medications   Medication Sig Dispense Refill   . Diclofenac Epolamine 1.3 % Transdermal Patch 12 hr 180 mg by Transdermal route Twice daily Please disregard prescription for diclofenac gel, use this instead 30 Patch 3   . Ibuprofen (MOTRIN) 600 mg Oral Tablet Take 1 Tab (600 mg total) by mouth Three times a day as needed for Pain 90 Tab 1   . meloxicam (MOBIC) 15 mg Oral Tablet Take 1 Tab (15 mg total) by mouth Once a day 30 Tab 2   . multivitamin Tab take 1 Tab by mouth Once a day.  0     No current  facility-administered medications for this visit.     Reviewed   ALLERGIES:  Iodine and iodide containing products and Sulfa (sulfonamides) Reviewed   SOCIAL HISTORY:   Social History     Socioeconomic History   . Marital status: Single     Spouse name: Not on file   . Number of children: 0   . Years of education: 18+   . Highest education level: Not on file   Occupational History   . Occupation: [employer]   Tobacco Use   . Smoking status: Never Smoker   . Smokeless tobacco: Never Used   Substance and Sexual Activity   . Alcohol use: No     Alcohol/week: 0.0 standard drinks     Comment: rare   . Drug use: No   . Sexual activity: Yes     Partners: Male     Birth control/protection: None     Comment: Partner for 12 years   Other Topics Concern   . Abuse/Domestic Violence No   . Breast Self Exam Yes   .  Caffeine Concern No   . Calcium intake adequate Yes   . Exercise Concern No     Comment: Tries to walk or bike.  Not getting as much exercise as she thinks she should.   Marland Kitchen Special Diet No     Comment: Only eating one meal a day now. Dinner is hit or miss.  Needs to eat more regularly she says.    . Ability to Walk 1 Flight of Steps without SOB/CP Yes   . Routine Exercise No   . Ability to Walk 2 Flight of Steps without SOB/CP Yes   . Unable to Ambulate No   . Total Care No   . Ability To Do Own ADL's Yes   . Uses Walker No   . Other Activity Level Yes   . Uses Cane No   Social History Narrative    Lives in [place].  No children. 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  Was an only child.  Got undergraduate degree at [employer] and a math degree at [employer].     Reviewed   FAMILY HISTORY:  Family Medical History:     Problem Relation (Age of Onset)    Cancer Other    Diabetes Mother    Hypertension (High Blood Pressure) Mother    Lung Cancer Maternal Grandmother    Stroke Mother (59)           Reviewed     ROS  Left shoulder  Left  elbow  Neck   - all others were reviewed and negative    PHYSICAL EXAM  Ht 1.778 m (5\' 10" )   Wt 106 kg (233 lb)   BMI 33.43 kg/m             GENERAL EXAM    Constitutional: She is oriented to person, place, and time. She appears well-developed and well-nourished.   HENT:   Head: Normocephalic and atraumatic.   Eyes: Conjunctivae are normal. Pupils are equal, round.   Neck: Normal range of motion.   Cardiovascular: Normal rate.    Pulmonary/Chest: Effort normal.   Abdominal: Non-protuberant   Neurological: She is alert and oriented to person, place, and time.   Skin: Skin is warm and dry.   Psychiatric: She has a normal mood and affect. Her behavior is normal. Judgment and thought content normal.     Nursing note and vitals reviewed.    ORTHO EXAM   Pain with palpation along lateral epicondyle  Normal range of motion of elbow  Neuro is in tact  Brisk cap refill  Range of motion left shoulder WNL  Strength is WNL  Impingement signs present    IMAGING     No new imaging today        ASSESSMENT      ICD-10-CM    1. Lateral epicondylitis of left elbow M77.12 Refer to External Physical Therapy   2. Rotator cuff tendonitis, left M75.82 Refer to External Physical Therapy   3. Neck pain M54.2 Refer to [place]   4. Thoracic spine pain M54.6 Refer to [place]   Upon evaluation today, we have discussed a continued nature of lateral epicondylitis as well as rotator cuff tendinitis.  I have offered a corticosteroid injection for the left elbow to treated inflammation for the epicondylitis however patient is reluctant to remove KT tape placed by physical  therapy yesterday.  She states that she will come back for an injection on the later date.  I have also recommended over-the-counter anti-inflammatory medications as I believe they will help.  I have offered and made a referral to physiatry for evaluation of the neck and thoracic spine.  The patient will follow up in clinic for possible left elbow  injection when she returns to the building to see Dr. Bernette Redbird.  We have discussed keeping the same restrictions of no lifting greater than 5 lb with the left arm in clinic today.  I have encouraged the patient to continue with physical therapy.  We will coordinate office visits with Dr. Bernette Redbird if patient agrees to corticosteroid injection of left elbow.         The patient states understanding and is in agreement with this plan.  Orders Placed This Encounter   . Refer to External Physical Therapy   . Refer to [place]   . Ibuprofen (MOTRIN) 600 mg Oral Tablet       The patient was advised to call with any questions, concerns or worsening symptoms.    This note was partially generated using MModal Fluency Direct system, and there may be some incorrect words, spellings, and punctuation that were not noted in checking the note before saving.    Hadley Pen, MA  I am scribing for, and in the presence of, Dr. Jaci Carrel for services provided on 04/29/2019.  Hadley Pen, MA       Note was read.  Assessment and plan is correct.  Any changes were noted   JJF

## 2019-05-08 ENCOUNTER — Telehealth (HOSPITAL_BASED_OUTPATIENT_CLINIC_OR_DEPARTMENT_OTHER): Payer: Self-pay | Admitting: Sports Medicine

## 2019-05-08 NOTE — Telephone Encounter (Signed)
Workers comp called regarding this patient to check the status of her plan of care.  I advised that Dr. Jaci Carrel is on vacation this week but once he returns on Monday he would be able to sign it.  He verbalized understanding.  Cleon Gustin, MA  05/08/2019, 10:59

## 2019-05-20 ENCOUNTER — Telehealth (HOSPITAL_BASED_OUTPATIENT_CLINIC_OR_DEPARTMENT_OTHER): Payer: Self-pay | Admitting: Sports Medicine

## 2019-05-20 NOTE — Telephone Encounter (Signed)
Alena Childers called regarding questions on this patient.  Called her back.  NA LM TCB.  Cleon Gustin, MA  05/20/2019, 17:08

## 2019-06-05 ENCOUNTER — Other Ambulatory Visit: Payer: Self-pay

## 2019-07-01 ENCOUNTER — Ambulatory Visit (HOSPITAL_BASED_OUTPATIENT_CLINIC_OR_DEPARTMENT_OTHER): Payer: Self-pay | Admitting: Physical Medicine & Rehabilitation

## 2019-07-10 ENCOUNTER — Telehealth (HOSPITAL_BASED_OUTPATIENT_CLINIC_OR_DEPARTMENT_OTHER): Payer: Self-pay | Admitting: Sports Medicine

## 2019-07-10 NOTE — Telephone Encounter (Signed)
Called and spoke with patient.  I advised that I had her scheduled with Dr. Jaci Carrel on Thursday next week at 11am.  I advised that if she completed her appointment earlier that she could check in.  She verbalized understanding.  She advised that she had been out of town working and had not been able to go to physical therapy.  She advised that the therapy place had advised that her order had expired.  I advised that the therapy order expiration date was 04/2020 and that normally workers compensation needs a new order every 30 days or at each visit to keep them valid.  I advised that I was not sure if that was why or not.  I asked when the last time was that she had attended physical therapy and she said she did not know that she had been doing home exercises.  I advised that this could also be the reason why workers comp had closed her case.  She asked me to put in an order for PT several times for 2 or 3 sessions so she can attend next week as she has taken off work and is already scheduled.  She advised she did not want to pay out of pocket.  I advised several times that without Dr. Berenice Primas permission to order PT that I could not place an order that I would need to check with him and see if he was ok doing the order now or if he would like to see and evaluate her at her appointment on

## 2019-07-10 NOTE — Telephone Encounter (Signed)
-----   Message from Elba Barman sent at 07/09/2019  4:13 PM EDT -----  Contact: [phone]  Would like a call back she got her appointment for [facility] on 07/17/19 @ 9:30am and she said she's suppose to see Jaci Carrel on the same day since she lives a ways away   Thank you  Kenney Houseman

## 2019-07-10 NOTE — Telephone Encounter (Signed)
Called and spoke with patient.  I advised that I had her scheduled with Dr. Jaci Carrel on Thursday next week at 11am.  I advised that if she completed her appointment earlier that she could check in.  She verbalized understanding.  She advised that she had been out of town working and had not been able to go to physical therapy.  She advised that the therapy place had advised that her order had expired.  I advised that the therapy order expiration date was 04/2020 and that normally workers compensation needs a new order every 30 days or at each visit to keep them valid.  I advised that I was not sure if that was why or not.  I asked when the last time was that she had attended physical therapy and she said she did not know that she had been doing home exercises.  I advised that this could also be the reason why workers comp had closed her case.  She asked me to put in an order for PT several times for 2 or 3 sessions so she can attend next week as she has taken off work and is already scheduled.  She advised she did not want to pay out of pocket.  I advised several times that without Dr. Berenice Primas permission to order PT that I could not place an order that I would need to check with him and see if he was ok doing the order now or if he would like to see and evaluate her at her appointment on 07/17/2019.  She advised that her back had really been bothering her and she just really needed the physical therapy.  I advised that we had sent her to physical therapy for her elbow and her shoulder and not her back.  I advised that we had referred her to Dr. Bernette Redbird for her back.  She advised several times that she just really wanted me to put in an order for the physical therapy for 2-3 sessions until she sees the doctor.  I again advised that without permission I can not just place an order.  She informed me that she really hoped that I would get ahold of Dr. Jaci Carrel ASAP and asked if I had a special way to get in touch with  him now for an answer.  I advised that I would send him a message but he is gone for the evening and that he is out tomorrow and Monday in surgery so I can not guarantee when I will get an answer.  She advised that she hoped I would get ahold of him soon and I advised as soon as I received an answer I would let her know.  She verbalized understanding.  Cleon Gustin, MA  07/10/2019, 17:10

## 2019-07-10 NOTE — Telephone Encounter (Signed)
-----   Message from Elba Barman sent at 07/09/2019  4:13 PM EDT -----  Contact: [phone]  Would like a call back she got her appointment for [facility] on 07/17/19 @ 9:30am and she said she's suppose to see Jaci Carrel on the same day since she lives a ways away   Thank you  Kenney Houseman

## 2019-07-14 ENCOUNTER — Telehealth (HOSPITAL_BASED_OUTPATIENT_CLINIC_OR_DEPARTMENT_OTHER): Payer: Self-pay | Admitting: Sports Medicine

## 2019-07-14 NOTE — Telephone Encounter (Signed)
Called pt and advised that I had spoken with Dr. Jaci Carrel regarding her getting a new PT order.  He advised that he would like to re-evaluate her on 10/22 before ordering any additional PT.  She advised that she would just pay out of pocket for it then until that visit because she was seen today.  She said that we would work out [employer] later and that she was sorry we didn't see things the way she did.  I advised we would see her on Thursday and she hung up.  Cleon Gustin, MA  07/14/2019, 12:28

## 2019-07-17 ENCOUNTER — Ambulatory Visit (HOSPITAL_BASED_OUTPATIENT_CLINIC_OR_DEPARTMENT_OTHER)
Admission: RE | Admit: 2019-07-17 | Discharge: 2019-07-17 | Disposition: A | Discharge status: Home / Self Care | Payer: Worker's Comp, Other unspecified | Source: Ambulatory Visit | Admitting: Radiology

## 2019-07-17 ENCOUNTER — Ambulatory Visit (INDEPENDENT_AMBULATORY_CARE_PROVIDER_SITE_OTHER): Payer: Worker's Comp, Other unspecified | Admitting: Physical Medicine & Rehabilitation

## 2019-07-17 ENCOUNTER — Ambulatory Visit
Admission: RE | Admit: 2019-07-17 | Discharge: 2019-07-17 | Disposition: A | Discharge status: Home / Self Care | Payer: Worker's Comp, Other unspecified | Source: Ambulatory Visit | Attending: Physician Assistant | Admitting: Physician Assistant

## 2019-07-17 ENCOUNTER — Other Ambulatory Visit: Payer: Self-pay

## 2019-07-17 ENCOUNTER — Ambulatory Visit: Payer: Worker's Comp, Other unspecified | Attending: Sports Medicine | Admitting: Sports Medicine

## 2019-07-17 ENCOUNTER — Encounter (HOSPITAL_BASED_OUTPATIENT_CLINIC_OR_DEPARTMENT_OTHER): Payer: Self-pay | Admitting: Physical Medicine & Rehabilitation

## 2019-07-17 ENCOUNTER — Encounter (HOSPITAL_BASED_OUTPATIENT_CLINIC_OR_DEPARTMENT_OTHER): Payer: Self-pay | Admitting: Sports Medicine

## 2019-07-17 VITALS — Ht 70.0 in | Wt 238.1 lb

## 2019-07-17 VITALS — BP 120/76 | HR 75 | Temp 97.7°F | Resp 16 | Ht 70.0 in | Wt 238.5 lb

## 2019-07-17 DIAGNOSIS — M4312 Spondylolisthesis, cervical region: Secondary | ICD-10-CM | POA: Insufficient documentation

## 2019-07-17 DIAGNOSIS — S29019A Strain of muscle and tendon of unspecified wall of thorax, initial encounter: Secondary | ICD-10-CM

## 2019-07-17 DIAGNOSIS — M546 Pain in thoracic spine: Secondary | ICD-10-CM

## 2019-07-17 DIAGNOSIS — M7712 Lateral epicondylitis, left elbow: Secondary | ICD-10-CM

## 2019-07-17 DIAGNOSIS — M25512 Pain in left shoulder: Secondary | ICD-10-CM

## 2019-07-17 DIAGNOSIS — S161XXA Strain of muscle, fascia and tendon at neck level, initial encounter: Secondary | ICD-10-CM

## 2019-07-17 DIAGNOSIS — S29012A Strain of muscle and tendon of back wall of thorax, initial encounter: Secondary | ICD-10-CM | POA: Insufficient documentation

## 2019-07-17 DIAGNOSIS — M7582 Other shoulder lesions, left shoulder: Secondary | ICD-10-CM

## 2019-07-17 DIAGNOSIS — M50322 Other cervical disc degeneration at C5-C6 level: Secondary | ICD-10-CM | POA: Insufficient documentation

## 2019-07-17 NOTE — H&P (Signed)
Assessment and Plan:     ICD-10-CM    1. Cervical strain  S16.1XXA XR CERVICAL SPINE SERIES W FLEX EXT     MRI SPINE CERVICAL WO CONTRAST   2. Thoracic back pain  M54.6 XR THORACIC SPINE     MRI SPINE THORACIC WO CONTRAST   3. Thoracic myofascial strain  S29.019A XR THORACIC SPINE     MRI SPINE THORACIC WO CONTRAST       Fully evaluated patient.   Reviewed and Discussed imaging.   Agree with Cheron Every, PA-C recommendations.     I am scribing for, and in the presence of, Dr. Peter Garter for services provided on 07/17/19.  Rebekah Megimose, SCRIBE     I personally performed the services described in this documentation, as scribed  in my presence, and it is both accurate  and complete.    Peter Garter, MD    Peter Garter, MD  Medical Director, Spine and [place] Department of Neurosurgery

## 2019-07-17 NOTE — H&P (Signed)
PATIENT NAME: Natasha Perez, Natasha Perez  HOSPITAL NUMBER:  [id number]  DATE OF SERVICE: 07/17/2019  DATE OF BIRTH:  1970-10-15    HISTORY AND PHYSICAL    CHIEF COMPLAINT:  Cervical and thoracic pain.    HISTORY OF PRESENT ILLNESS:  Ms. Rissmiller is a 48 year old female who comes to our service for evaluation and treatment.  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  The injury occurred sometime in March 2020.  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  In terms of her left shoulder and left arm, these are being followed by orthopaedics, Dr. Jaci Carrel.  She had MRI of the left shoulder that showed some evidence of tendinopathy.  No surgical issues.  MRI of the left elbow was essentially negative for any acute process, but she complains of swelling in the left forearm.  She fractured her right ribs which were treated nonsurgically as well.  She went back to work about 8 or 10 weeks ago, but has persistent back pain and pain in the neck, left shoulder, and left side of her thoracic spine.  She denies any numbness, tingling, or paresthesias to her hands or feet.  No difficulty with balance, coordination, or anything of that nature.    ALLERGIES:  1. IODINE.  2. SULFA.    PAST MEDICAL HISTORY:  Significant for melanoma, cancer, ectopic pregnancy.    FAMILY HISTORY:  Significant for hypertension, diabetes, stroke, lung cancer.    MEDICATION:  Motrin.    SOCIAL HISTORY:  The patient denies smoking, alcohol or drug use.    SURGICAL HISTORY:  Myomectomy, refractive surgery, breast biopsy.    REVIEW OF SYSTEMS:  No shortness of breath, chest pain, nausea, diaphoresis, or chills.  All other review of systems not mentioned above are negative.    PHYSICAL EXAMINATION:  Vital signs:  Blood pressure 120/76, pulse 75, temperature 97.7 degrees Fahrenheit, respirations 16, weight  108.2, height 5 feet 10 inches, sats 99% on room air.  General:  Alert and oriented x4.  Pleasant, awake, and cooperative.  HEENT:  Unremarkable.  Neuromuscular:  Cranial nerves II through XII are intact.  Reflexes intact.  No ankle clonus.  Babinski is downgoing.  Some tenderness palpated in the proximal thoracic spine around T5-T6.  She did have some guarding with palpation.  Reflexes within normal limits.  No upper motor neuron signs.  She had pain and discomfort with cervical rotation and extension to the left.  She did have impingement sign to the left shoulder.    IMAGING STUDIES:  As discussed above.    ASSESSMENT:  This is a very nice lady with evidence of cervical and thoracic strain-rule out fracture or acute injury, possible thoracic herniated disk-possible cervical herniated disk.    PLAN:  After a long discussion with the patient, recommend MRI cervical and thoracic spine.  Await Workers' Economist.  The patient will follow up with [place] thereafter.  She is comfortable with these recommendations.     This patient was seen and examined in conjunction with Dr. Bernette Redbird.        Loleta Dicker, PA-C   Department of Neurosurgery       Peter Garter, MD  Medical Director, Spine and [place] Department of Neurosurgery  DD:  07/17/2019 13:07:11  DT:  07/17/2019 13:43:09 NW  D#:  [id number]

## 2019-07-18 NOTE — Progress Notes (Signed)
[facility], MEDICAL OFFICE BUILDING  [place] [address]      NAME: Natasha Perez  DATE: 07/17/2019  DOB: [birth year]  MRN: [id number]      CHIEF COMPLAINT   Follow-up (left elbow and shoulder )    HPI:  Natasha Perez is a 48 year old female here for a follow-up on her left elbow and left shoulder pain.  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  She has had no new injuries.  She states she is doing good.  She states she has returned back to work but is still in pain.  She complains of scapular pain.  She states she feels that most of her pain is muscular.  She states her shoulder has the most pain.  She states her elbow is improving.  She states she has been doing home exercises and has not been able to attend regular physical therapy due to her work schedule.  She states she attended 2 sessions earlier this week.  She complains of constant moderate pain with working that she rates at 4 of 10. She has been using ice, physical therapy, ibuprofen, and a tens unit for pain with relief.  She saw Dr. Bernette Redbird in the office today for her back pain.  He had ordered x-rays for her which she completed in the office.  She denies instability, buckling, popping, clicking, locking or catching.  She has other calf, neurovascular, or radicular-type signs or symptoms.    PAST MEDICAL HISTORY:  Patient Active Problem List   Diagnosis   . Melanoma (CMS [place])   . Fibroid   . Back pain   . Vitamin D deficiency   . Infertility management   . History of ectopic pregnancy    REVIEWED   PAST SURGICAL HISTORY:  Past Surgical History:   Procedure Laterality Date   . HX BREAST BIOPSY Right     needle bx.  age  83    benign   . HX MYOMECTOMY  2005   . HX OTHER      3 right leg surgeries for melanoma   . HX REFRACTIVE SURGERY  2000        REVIEWED   CURRENT MEDICATIONS:   Current Outpatient Medications   Medication Sig Dispense Refill   . Ibuprofen (MOTRIN) 600 mg Oral Tablet Take 1 Tab (600 mg total) by mouth Three times  a day as needed for Pain 90 Tab 1   . multivitamin Tab take 1 Tab by mouth Once a day.  0     No current facility-administered medications for this visit.     REVIEWED   ALLERGIES:  Iodine and iodide containing products and Sulfa (sulfonamides) REVIEWED   SOCIAL HISTORY:   Social History     Socioeconomic History   . Marital status: Single     Spouse name: Not on file   . Number of children: 0   . Years of education: 18+   . Highest education level: Not on file   Occupational History   . Occupation: [employer]   Tobacco Use   . Smoking status: Never Smoker   . Smokeless tobacco: Never Used   Substance and Sexual Activity   . Alcohol use: No     Alcohol/week: 0.0 standard drinks     Comment: rare   . Drug use: No   . Sexual activity: Yes     Partners: Male  Birth control/protection: None     Comment: Partner for 12 years   Other Topics Concern   . Abuse/Domestic Violence No   . Breast Self Exam Yes   . Caffeine Concern No   . Calcium intake adequate Yes   . Exercise Concern No     Comment: Tries to walk or bike.  Not getting as much exercise as she thinks she should.   Marland Kitchen Special Diet No     Comment: Only eating one meal a day now. Dinner is hit or miss.  Needs to eat more regularly she says.    . Ability to Walk 1 Flight of Steps without SOB/CP Yes   . Routine Exercise No   . Ability to Walk 2 Flight of Steps without SOB/CP Yes   . Unable to Ambulate No   . Total Care No   . Ability To Do Own ADL's Yes   . Uses Walker No   . Other Activity Level Yes   . Uses Cane No   Social History Narrative    Lives in [place].  No children. 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  Was an only child.  Got undergraduate degree at [employer] and a math degree at [employer].     REVIEWED   FAMILY HISTORY:  Family Medical History:     Problem Relation (Age of Onset)    Cancer Other    Diabetes Mother    Hypertension (High Blood Pressure) Mother       Lung Cancer Maternal Grandmother    Stroke Mother (20)           REVIEWED     ROS  Left elbow pain  Left shoulder pain - all others were reviewed and negative    PHYSICAL EXAM  Ht 1.778 m (5\' 10" )   Wt 108 kg (238 lb 1.6 oz)   BMI 34.16 kg/m             GENERAL EXAM    Constitutional: She is oriented to person, place, and time. She appears well-developed and well-nourished.   HENT:   Head: Normocephalic and atraumatic.   Eyes: Conjunctivae are normal. Pupils are equal, round.   Neck: Normal range of motion.   Cardiovascular: Normal rate.    Pulmonary/Chest: Effort normal.   Abdominal: Non-protuberant   Neurological: She is alert and oriented to person, place, and time.   Skin: Skin is warm and dry.   Psychiatric: She has a normal mood and affect. Her behavior is normal. Judgment and thought content normal.     Nursing note and vitals reviewed.    ORTHO EXAM   LEFT ELBOW-  No deformity.  No swelling, ecchymosis.  Normal skin.  Normal muscle tone.  No fluctuance, drainage or erythema.  Flexion, extension, supination and pronation are normal and pain free.  Strength is 5/5 in those directions.  Pain with palpation along lateral epicondyle.  No mechanical clicking, catching or blocks.  Distal biceps and triceps tendons are intact.  No instability to varus and valgus stress at full extension and 30 degrees flexion.  Muscle compartments are soft.  Normal sensory exam in all distributions.  Pulses are palpable.    Left shoulder-  No deformity and normal muscle tone.  Normal skin.  No fluctuance, erythema or drainage.  Forward flexion, abduction, external rotation and internal rotation are normal.  No scapular winging with motion.  Strength is 5/5 with  abduction in the scapular plane as well as external rotation.  Subscapularis is intact.  No pain with palpation of AC joint, greater or lesser tuberosities, or bicipital groove.  No instability signs with apprehension, relocation, or load and shift  tests.  Hawkins and  Neer's testing is positive.  Negative cross-over test.  Negative O'Brien's test, Neer's test, and Yergason's test for biceps.  No clicking, catching or mechanical symptoms.  Sensory exam and motor exam in that upper extremity is normal.  Cervical ROM does not reproduce pain.      IMAGING     No new imaging today.        ASSESSMENT      ICD-10-CM    1. Rotator cuff tendonitis, left  M75.82 Refer to External Physical Therapy   2. Lateral epicondylitis of left elbow  M77.12 Refer to External Physical Therapy   3. Left shoulder pain  M25.512 Refer to External Physical Therapy           PLAN   The plan for Emberleigh is to have her continue with her physical therapy.  We advised that at this point there is nothing surgical that we can offer her.  We did provide her with a physical therapy prescription as she states it is continuing to help her.  At this point we advised that we do not have much more to offer her as the majority of her pain seems to be musculature in nature.  We advised that we would release her to Dr. Dessie Coma care.  We will plan to see her back in our office on as-needed basis.  She verbalized understanding and agreed with this plan.              I am scribing for, and in the presence of, Dr. Soundra Pilon for services provided on 07/17/2019.    Cleon Gustin, MA            The above-stated note was read and reviewed thoroughly.  Patient was seen by myself in the presence of Octaviano Glow MA/Scribe.  I attest that the history and physical exam, assessment, and plan are correct.

## 2019-07-21 ENCOUNTER — Ambulatory Visit (HOSPITAL_COMMUNITY): Payer: Self-pay | Admitting: Internal Medicine

## 2019-07-25 ENCOUNTER — Ambulatory Visit (HOSPITAL_BASED_OUTPATIENT_CLINIC_OR_DEPARTMENT_OTHER): Payer: Self-pay | Admitting: Physical Medicine & Rehabilitation

## 2019-07-25 NOTE — Telephone Encounter (Signed)
I called and left  VM for Melissa to get back with me on the status of an MRI that I am trying to get approved for the patient.  I will await for a response.  Lita Mains [facility]  07/25/2019, 08:31

## 2019-08-01 ENCOUNTER — Ambulatory Visit (HOSPITAL_BASED_OUTPATIENT_CLINIC_OR_DEPARTMENT_OTHER): Payer: Self-pay | Admitting: Physical Medicine & Rehabilitation

## 2019-08-01 NOTE — Telephone Encounter (Signed)
I called and left a VM for [name].  I have a request for a patient to receive a MRI of the neck and thoracic.  I will await for a response.  Natasha Perez [facility]  08/01/2019, 08:27

## 2019-08-05 ENCOUNTER — Ambulatory Visit (HOSPITAL_BASED_OUTPATIENT_CLINIC_OR_DEPARTMENT_OTHER): Payer: Self-pay | Admitting: Physical Medicine & Rehabilitation

## 2019-08-05 NOTE — Telephone Encounter (Signed)
Melissa called requesting an updated work status for patient. Let me know.

## 2019-08-07 ENCOUNTER — Encounter (HOSPITAL_BASED_OUTPATIENT_CLINIC_OR_DEPARTMENT_OTHER): Payer: Self-pay | Admitting: Physical Medicine & Rehabilitation

## 2019-08-07 ENCOUNTER — Telehealth (HOSPITAL_BASED_OUTPATIENT_CLINIC_OR_DEPARTMENT_OTHER): Payer: Self-pay | Admitting: Physician Assistant

## 2019-08-07 ENCOUNTER — Encounter (HOSPITAL_BASED_OUTPATIENT_CLINIC_OR_DEPARTMENT_OTHER): Payer: Self-pay

## 2019-08-07 NOTE — Telephone Encounter (Signed)
Patient called me i believe by mistake trying to reach [facility].    Mardelle Matte, MA

## 2019-08-07 NOTE — Telephone Encounter (Signed)
Called pt back.  NA LM TCB.  Cleon Gustin, MA  08/07/2019, 16:33

## 2019-08-18 ENCOUNTER — Other Ambulatory Visit (HOSPITAL_COMMUNITY): Payer: Self-pay

## 2019-08-30 ENCOUNTER — Ambulatory Visit (HOSPITAL_COMMUNITY)
Admission: RE | Admit: 2019-08-30 | Discharge: 2019-08-30 | Disposition: A | Discharge status: Home / Self Care | Payer: Worker's Comp, Other unspecified | Source: Ambulatory Visit

## 2019-08-30 ENCOUNTER — Other Ambulatory Visit: Payer: Self-pay

## 2019-08-30 ENCOUNTER — Ambulatory Visit
Admission: RE | Admit: 2019-08-30 | Discharge: 2019-08-30 | Disposition: A | Discharge status: Home / Self Care | Payer: Worker's Comp, Other unspecified | Source: Ambulatory Visit | Attending: Physician Assistant | Admitting: Physician Assistant

## 2019-08-30 DIAGNOSIS — D1809 Hemangioma of other sites: Secondary | ICD-10-CM | POA: Insufficient documentation

## 2019-08-30 DIAGNOSIS — M546 Pain in thoracic spine: Secondary | ICD-10-CM | POA: Insufficient documentation

## 2019-08-30 DIAGNOSIS — M50322 Other cervical disc degeneration at C5-C6 level: Secondary | ICD-10-CM | POA: Insufficient documentation

## 2019-08-30 DIAGNOSIS — M50222 Other cervical disc displacement at C5-C6 level: Secondary | ICD-10-CM | POA: Insufficient documentation

## 2019-08-30 DIAGNOSIS — S29019A Strain of muscle and tendon of unspecified wall of thorax, initial encounter: Secondary | ICD-10-CM

## 2019-08-30 DIAGNOSIS — S161XXA Strain of muscle, fascia and tendon at neck level, initial encounter: Secondary | ICD-10-CM | POA: Insufficient documentation

## 2019-09-10 ENCOUNTER — Encounter (HOSPITAL_BASED_OUTPATIENT_CLINIC_OR_DEPARTMENT_OTHER): Payer: Self-pay | Admitting: Physical Medicine & Rehabilitation

## 2019-10-10 ENCOUNTER — Other Ambulatory Visit: Payer: Self-pay

## 2019-10-10 ENCOUNTER — Encounter (HOSPITAL_BASED_OUTPATIENT_CLINIC_OR_DEPARTMENT_OTHER): Payer: Self-pay | Admitting: Physical Medicine & Rehabilitation

## 2019-10-10 ENCOUNTER — Ambulatory Visit: Payer: Worker's Comp, Other unspecified | Attending: Emergency Medicine | Admitting: Physical Medicine & Rehabilitation

## 2019-10-10 ENCOUNTER — Ambulatory Visit (HOSPITAL_COMMUNITY): Payer: Worker's Comp, Other unspecified

## 2019-10-10 ENCOUNTER — Telehealth (HOSPITAL_COMMUNITY): Payer: Self-pay

## 2019-10-10 VITALS — BP 134/86 | HR 76 | Temp 97.3°F | Resp 16 | Ht 70.5 in | Wt 244.3 lb

## 2019-10-10 DIAGNOSIS — Z20828 Contact with and (suspected) exposure to other viral communicable diseases: Secondary | ICD-10-CM

## 2019-10-10 DIAGNOSIS — S29019A Strain of muscle and tendon of unspecified wall of thorax, initial encounter: Secondary | ICD-10-CM

## 2019-10-10 DIAGNOSIS — R059 Cough, unspecified: Secondary | ICD-10-CM

## 2019-10-10 DIAGNOSIS — R05 Cough: Secondary | ICD-10-CM | POA: Insufficient documentation

## 2019-10-10 DIAGNOSIS — Z20822 Contact with and (suspected) exposure to covid-19: Secondary | ICD-10-CM | POA: Insufficient documentation

## 2019-10-10 DIAGNOSIS — E041 Nontoxic single thyroid nodule: Secondary | ICD-10-CM

## 2019-10-10 NOTE — Patient Instructions (Signed)
Thank you for visiting our drive through Coronavirus testing site.     You or your family member is a Person Under Investigation (called a PUI). As a PUI you are considered contagious, until the swab comes back telling you/us that you do not have Covid-19.    Here is what you must do now:  . Return home, no stops to shop or social events;  . Stay at home in isolation;  . No visitors to your home;  . Call you provider if you are worse to ask for guidance.    Results:  . We anticipate your result will be available in 7 days.  . Results will be sent to [facility].  If you do not have access to [facility], please go here to sign-up: [email or web address]  . Someone from our team will contact you when your result is available.  . You will be given further instructions about how long to stay in isolation at that time.    For the most current information on Coronavirus (COVID-19), please check these sites:  [facility]:  [email or web address]    Centers for Disease Control:  https://www.cdc.gov/coronavirus/2019-nCoV/index.html

## 2019-10-10 NOTE — Progress Notes (Signed)
COVID-19 SCREENING NOTE    Primary Care Provider: No Pcp     Has the patient had recent travel to/from or exposure to someone with recent travel to a COVID-19 "hot spot"? 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 No  Has the patient had cough? Yes  Has the patient had shortness of breath? No  Additional symptoms? fatigue, headache     Patient Active Problem List   Diagnosis   . Melanoma (CMS [place])   . Fibroid   . Back pain   . Vitamin D deficiency   . Infertility management   . History of ectopic pregnancy         Occupation: Data Unavailable    Disposition:   SCREENING INDICATED.  Order to be placed. SCREENING SITE: [facility].  Patient phone number and address confirmed: [phone], [place] [address].      Verbal consent obtained from the patient to be contacted with result.    Loann Quill, RN

## 2019-10-10 NOTE — Progress Notes (Signed)
Physical Medicine and Rehabilitation Return Patient Visit  Patient Name: Natasha Perez  MRN: [id number]  DOB: Jun 23, 1971  DOS: 10/10/2019    Chief Complaint:   Chief Complaint   Patient presents with   . Back Pain   . Neck Pain   . Thoracic Spine Pain     left   . Shoulder Pain     left     History of Present Illness: Natasha Perez is a pleasant 49 y.o.female who presents to [place] for return follow up evaluation. Patient was last seen on 07/17/2019 for acute cervicothoracic pain. At that time, the patient was scheduled for an MRI of the cervical and thoracic spine. Today, the patient comes in for follow-up evaluation post MRIs and to address significantly lessened, however, persistent cervicothoracic pain that radiates throughout her left shoulder and LUE. 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 She was treated by [facility], Dr. Jaci Carrel in relation to the arm and shoulder. The pain is exacerbated with overexertion, but improves with activity and intermittent rest. Pain scale: 2/10. Physical therapy has been utilized as treatment with significant relief/improvement. Ibuprofen has been utilized as treatment with moderate relief. The patient denies fever, chills, night sweats, weight changes, chest pain, shortness of breath, appetite changes, bowel or bladder incontinence, saddle paresthesias, headaches, dizziness, sleep changes, and all other complaints at this time. PMHx includes Melanoma. PSHx includes Myomectomy.     Past Medical History:  Past Medical History:   Diagnosis Date   . Cancer (CMS [place])    . Ectopic pregnancy     2005   . Melanoma (CMS [place]) 1999    right thigh, had 2 or 3  surgeries at [facility], sampled lymph nodes which were ok, still sees dermatologist yearly in [place]     Past Surgical History:  Past Surgical History:   Procedure Laterality Date   . HX BREAST BIOPSY Right     needle bx.  age  29    benign   . HX MYOMECTOMY  2005   . HX OTHER      3 right leg surgeries for melanoma   . HX REFRACTIVE SURGERY  2000     Social History:  Social History     Socioeconomic History   . Marital status: Single     Spouse name: Not on file   . Number of children: 0   . Years of education: 18+   . Highest education level: Not on file   Occupational History   . Occupation: [employer]   Social Needs   . Financial resource strain: Not on file   . Food insecurity     Worry: Not on file     Inability: Not on file   . Transportation needs     Medical: Not on file     Non-medical: Not on file   Tobacco Use   . Smoking status: Never Smoker   . Smokeless tobacco: Never Used   Substance and Sexual Activity   . Alcohol use: No     Alcohol/week: 0.0 standard drinks     Comment: rare   .  Drug use: No   . Sexual activity: Yes     Partners: Male     Birth control/protection: None     Comment: Partner for 12 years   Lifestyle   . Physical activity     Days per week: Not on file     Minutes per session: Not on file   . Stress: Not on file   Relationships   . Social Product manager on phone: Not on file     Gets together: Not on file     Attends religious service: Not on file     Active member of club or organization: Not on file     Attends meetings of clubs or organizations: Not on file     Relationship status: Not on file   . Intimate partner violence     Fear of current or ex partner: Not on file     Emotionally abused: Not on file     Physically abused: Not on file     Forced sexual activity: Not on file   Other Topics Concern   . Abuse/Domestic Violence No   . Breast Self Exam Yes   . Caffeine Concern No   . Calcium intake adequate Yes   . Computer Use Not Asked   .  Drives Not Asked   . Exercise Concern No     Comment: Tries to walk or bike.  Not getting as much exercise as she thinks she should.   Marland Kitchen Helmet Use Not Asked   . Seat Belt Not Asked   . Special Diet No     Comment: Only eating one meal a day now. Dinner is hit or miss.  Needs to eat more regularly she says.    . Sunscreen used Not Asked   . Uses Cane Not Asked   . Uses walker Not Asked   . Uses wheelchair Not Asked   . Right hand dominant Not Asked   . Left hand dominant Not Asked   . Ambidextrous Not Asked   . Shift Work Not Asked   . Unusual Sleep-Wake Schedule Not Asked   . Ability to Walk 1 Flight of Steps without SOB/CP Yes   . Routine Exercise No   . Ability to Walk 2 Flight of Steps without SOB/CP Yes   . Unable to Ambulate No   . Total Care No   . Ability To Do Own ADL's Yes   . Uses Walker No   . Other Activity Level Yes   . Uses Cane No   Social History Narrative    Lives in [place].  No children. 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  Was an only child.  Got undergraduate degree at [employer] and a math degree at [employer].      Family History:  Family Medical History:     Problem Relation (Age of Onset)    Cancer Other    Diabetes Mother    Hypertension (High Blood Pressure) Mother    Lung Cancer Maternal Grandmother    Stroke Mother (59)        Medications:  Outpatient Medications Marked as Taking for the 10/10/19 encounter (Office Visit) with Peter Garter, MD   Medication Sig   . Ibuprofen (MOTRIN) 600 mg Oral Tablet Take 1 Tab (600 mg total) by mouth Three times a day as needed for Pain   . multivitamin Tab take  1 Tab by mouth Once a day.     Allergies:  Allergies   Allergen Reactions   . Iodine And Iodide Containing Products Itching, Swelling and Hives/ Urticaria   . Sulfa (Sulfonamides) Nausea/ Vomiting     Review of Systems:   Constitutional: Negative for fever, chills, night sweats, or weight changes.   Eyes: Negative for vision  changes.  Cardiovascular: Negative for chest pain or palpitations.  Respiratory: Negative for shortness of breath.  Gastrointestinal: Negative for abdominal pain, nausea, or vomiting.  Genitourinary: Negative for bladder incontinence.   Musculoskeletal: Positive for cervicothoracic, left shoulder, and LUE pain.  Neurological: Negative for bowel or bladder incontinence, saddle paresthesias, numbness, tingling, headaches, or dizziness.   Skin: Negative for rash.  Psychiatric: Negative for sleep changes.   All other systems reviewed and are negative.     Physical Examination:  BP 134/86   Pulse 76   Temp 36.3 C (97.3 F)   Resp 16   Ht 1.791 m (5' 10.5")   Wt 111 kg (244 lb 4.3 oz)   SpO2 97%   BMI 34.55 kg/m   Constitutional: 49 y.o. female in no acute distress. Awake. Cooperative. Alert and oriented x4.   HEENT: Unremarkable  Neck: Supple, symmetric, trachea midline, no masses. The thyroid appears normal, no thyromegaly.   Respiratory: No Respiratory distress.  Skin: Warm, dry, and no rashes.   Neuromuscular: Alert and oriented x3 with normal speech. Attention span and concentration normal. Cognitive function is normal. Coordination is normal. Cranial nerves 2-12 intact. Sensation is normal in upper and lower extremities. Normal muscle tone in upper and lower extremities.    Gait: Gait Patterns: Normal   Cervical Spine: Spurling's test is negative. ROM: Flexion, extension, rotation, and lateral bending are within normal limits. Stability: no step off, no atlantoaxial instability.   Thoracic Spine: ROM: Flexion, Extension , Rotation, and Lateral bending are within normal limits. Stability: no step off, stable spine.    Motor examination:   Arm Right Left Leg Right Left   Shoulder abduction (C5) 5/5 5/5 Hip flexion (L2) 5/5 5/5   Wrist extension (C6) 5/5 5/5 Knee extension (L3) 5/5 5/5   Wrist flexion (C7) 5/5 5/5 Foot Dorsi Flexion (L4) 5/5 5/5   Finger flexion (C8) 5/5 5/5 Toe extension (L5) 5/5 5/5    Finger ab/adduction (T1) 5/5 5/5 Foot plantar flexion (S1) 5/5 5/5    Reflexes:    Bicep BR Triceps Patella Achilles Babinski Ankle Clonus Hoffman's   Right 2+ 2+ 2+ 2+ 2+ Downgoing Not present Not present   Left 2+ 2+ 2+ 2+ 2+ Downgoing Not present Not present   The patient was fully assessed, evaluated and examined today.    Diagnostic Studies:   08/30/2019 MRI SPINE CERVICAL WO CONTRAST: Multiplanar multisequence MRI cervical spine without contrast. Vertebral body height, alignment, and marrow signal are maintained. The cervical spinal cord has normal caliber and signal. At the C5-C6 level, there is posterior disc bulge indenting the ventral thecal sac with some mild encroachment upon the lateral recesses and neural foramina. The other intervertebral discs are normal. No focal herniation is evident. Posterior disc bulge with degenerative disc disease at C5-C6 causing mild impingement upon the thecal sac and neural foramina.  08/30/2019 MRI SPINE THORACIC WO CONTRAST: Multiplanar multisequence MRI of thoracic spine without contrast. Vertebral body height, alignment, and marrow signal are maintained. 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 The thoracic spinal cord has normal caliber and  signal. There is no disc bulge or herniation. There is no spinal canal stenosis or compromise of the neural foramina. Paraspinous soft tissues are within normal limits. Vertebral body hemangiomas. No evidence for disc herniation, spinal stenosis or neural foraminal narrowing.    Assessment and Plan:     ICD-10-CM    1. Thoracic myofascial strain  S29.019A    2. Thyroid cyst  E04.1    Recommend patient follow-up with PCP in relation to thyroid cysts. Recommend patient continue a home exercise program. Will follow up as needed, unless otherwise noted. Patient expressed understanding of this plan and has no further questions at this time.        I am scribing for, and in the presence of, Dr. Peter Garter for services provided on 10/10/2019.  Rebekah Megimose, SCRIBE     I personally performed the services described in this documentation, as scribed  in my presence, and it is both accurate  and complete.    Peter Garter, MD    Peter Garter, MD  Medical Director, Spine and [place] Department of Neurosurgery

## 2019-10-12 LAB — COVID-19 SCREENING - SEND-OUT: SARS-COV-2 RNA: NOT DETECTED

## 2019-10-16 ENCOUNTER — Telehealth (HOSPITAL_BASED_OUTPATIENT_CLINIC_OR_DEPARTMENT_OTHER): Payer: Self-pay | Admitting: Sports Medicine

## 2019-10-16 NOTE — Telephone Encounter (Signed)
Tillie Rung from workers comp called in regards to patient. Stated that she needed a 25R form filled out. I called her back and let her know that we did not have that form. I asked her to have it faxed to our office and we would have Dr.Fazalare take a look at it. She stated she would try and get a form to have it faxed. Advised her to call back with any additional questions or concerns. Alease Medina, [place]  10/16/2019, 15:58

## 2019-12-19 ENCOUNTER — Other Ambulatory Visit (HOSPITAL_COMMUNITY): Payer: Self-pay | Admitting: NURSE PRACTITIONER, FAMILY

## 2019-12-19 DIAGNOSIS — Z1231 Encounter for screening mammogram for malignant neoplasm of breast: Secondary | ICD-10-CM

## 2019-12-19 DIAGNOSIS — Z803 Family history of malignant neoplasm of breast: Secondary | ICD-10-CM

## 2019-12-20 ENCOUNTER — Encounter (HOSPITAL_BASED_OUTPATIENT_CLINIC_OR_DEPARTMENT_OTHER): Payer: Self-pay

## 2019-12-20 ENCOUNTER — Other Ambulatory Visit: Payer: Self-pay

## 2019-12-20 ENCOUNTER — Ambulatory Visit
Admission: RE | Admit: 2019-12-20 | Discharge: 2019-12-20 | Disposition: A | Discharge status: Home / Self Care | Payer: BC Managed Care – PPO | Source: Ambulatory Visit | Attending: NURSE PRACTITIONER, FAMILY | Admitting: NURSE PRACTITIONER, FAMILY

## 2019-12-20 DIAGNOSIS — Z803 Family history of malignant neoplasm of breast: Secondary | ICD-10-CM | POA: Insufficient documentation

## 2019-12-20 DIAGNOSIS — Z1231 Encounter for screening mammogram for malignant neoplasm of breast: Secondary | ICD-10-CM | POA: Insufficient documentation

## 2020-06-02 ENCOUNTER — Other Ambulatory Visit (INDEPENDENT_AMBULATORY_CARE_PROVIDER_SITE_OTHER): Payer: Self-pay | Admitting: PEDIATRICS-PEDIATRIC CARDIOLOGY

## 2020-06-02 DIAGNOSIS — R519 Headache, unspecified: Secondary | ICD-10-CM

## 2020-06-03 ENCOUNTER — Other Ambulatory Visit (INDEPENDENT_AMBULATORY_CARE_PROVIDER_SITE_OTHER): Payer: Self-pay | Admitting: Pediatrics

## 2020-06-03 ENCOUNTER — Other Ambulatory Visit (FREE_STANDING_LABORATORY_FACILITY): Payer: Self-pay | Admitting: PEDIATRICS-PEDIATRIC CARDIOLOGY

## 2020-06-03 ENCOUNTER — Encounter (FREE_STANDING_LABORATORY_FACILITY): Admit: 2020-06-03 | Discharge: 2020-06-03 | Disposition: A | Discharge status: Home / Self Care | Payer: Self-pay

## 2020-06-03 DIAGNOSIS — R519 Headache, unspecified: Secondary | ICD-10-CM

## 2020-06-03 DIAGNOSIS — G8929 Other chronic pain: Secondary | ICD-10-CM

## 2020-06-03 LAB — SARS-COV-2 ANTI-NUCLEOCAPSID IGG ANTIBODY: SARSCOV2IGG: NEGATIVE

## 2020-08-18 ENCOUNTER — Other Ambulatory Visit (INDEPENDENT_AMBULATORY_CARE_PROVIDER_SITE_OTHER): Payer: Self-pay | Admitting: Internal Medicine

## 2020-08-18 MED ORDER — LISINOPRIL 10 MG TABLET
10.00 mg | ORAL_TABLET | Freq: Two times a day (BID) | ORAL | 3 refills | Status: DC
Start: 2020-08-18 — End: 2020-08-24

## 2020-08-24 ENCOUNTER — Other Ambulatory Visit: Payer: Self-pay

## 2020-08-24 ENCOUNTER — Other Ambulatory Visit (HOSPITAL_BASED_OUTPATIENT_CLINIC_OR_DEPARTMENT_OTHER): Payer: Self-pay | Admitting: Physician Assistant

## 2020-08-24 ENCOUNTER — Ambulatory Visit
Admission: RE | Admit: 2020-08-24 | Discharge: 2020-08-24 | Disposition: A | Discharge status: Home / Self Care | Payer: Medicare (Managed Care) | Source: Ambulatory Visit | Attending: Physician Assistant | Admitting: Physician Assistant

## 2020-08-24 ENCOUNTER — Ambulatory Visit (HOSPITAL_BASED_OUTPATIENT_CLINIC_OR_DEPARTMENT_OTHER): Payer: Medicare (Managed Care)

## 2020-08-24 ENCOUNTER — Ambulatory Visit (HOSPITAL_BASED_OUTPATIENT_CLINIC_OR_DEPARTMENT_OTHER): Payer: Medicare (Managed Care) | Admitting: Physician Assistant

## 2020-08-24 ENCOUNTER — Encounter (HOSPITAL_BASED_OUTPATIENT_CLINIC_OR_DEPARTMENT_OTHER): Payer: Self-pay | Admitting: Physician Assistant

## 2020-08-24 ENCOUNTER — Ambulatory Visit (HOSPITAL_BASED_OUTPATIENT_CLINIC_OR_DEPARTMENT_OTHER): Payer: BC Managed Care – PPO | Admitting: Physician Assistant

## 2020-08-24 VITALS — BP 122/78 | HR 81 | Temp 97.3°F | Resp 16 | Ht 70.51 in | Wt 254.4 lb

## 2020-08-24 DIAGNOSIS — Z131 Encounter for screening for diabetes mellitus: Secondary | ICD-10-CM

## 2020-08-24 DIAGNOSIS — Z13220 Encounter for screening for lipoid disorders: Secondary | ICD-10-CM | POA: Insufficient documentation

## 2020-08-24 DIAGNOSIS — Z0000 Encounter for general adult medical examination without abnormal findings: Secondary | ICD-10-CM | POA: Insufficient documentation

## 2020-08-24 DIAGNOSIS — Z8349 Family history of other endocrine, nutritional and metabolic diseases: Secondary | ICD-10-CM

## 2020-08-24 DIAGNOSIS — Z1329 Encounter for screening for other suspected endocrine disorder: Secondary | ICD-10-CM | POA: Insufficient documentation

## 2020-08-24 DIAGNOSIS — I10 Essential (primary) hypertension: Secondary | ICD-10-CM

## 2020-08-24 LAB — CBC
HCT: 47.8 % — ABNORMAL HIGH (ref 34.8–46.0)
HGB: 15.7 g/dL (ref 11.5–16.0)
MCH: 28.5 pg (ref 26.0–32.0)
MCHC: 32.8 g/dL (ref 31.0–35.5)
MCV: 86.8 fL (ref 78.0–100.0)
MPV: 8.6 fL — ABNORMAL LOW (ref 8.7–12.5)
PLATELETS: 408 10*3/uL — ABNORMAL HIGH (ref 150–400)
RBC: 5.51 10*6/uL — ABNORMAL HIGH (ref 3.85–5.22)
RDW-CV: 12.9 % (ref 11.5–15.5)
WBC: 6.8 10*3/uL (ref 3.7–11.0)

## 2020-08-24 LAB — LIPID PANEL
CHOL/HDL RATIO: 3.4
CHOLESTEROL: 174 mg/dL (ref 100–200)
HDL CHOL: 51 mg/dL (ref >=50)
LDL CALC: 106 mg/dL — ABNORMAL HIGH (ref <100)
NON-HDL: 123 mg/dL (ref <=190)
TRIGLYCERIDES: 86 mg/dL (ref <150)
VLDL CALC: 17 mg/dL (ref <30)

## 2020-08-24 LAB — COMPREHENSIVE METABOLIC PNL, FASTING
ALBUMIN: 4.1 g/dL (ref 3.5–5.0)
ALKALINE PHOSPHATASE: 102 U/L (ref 50–130)
ALT (SGPT): 21 U/L (ref 8–22)
ANION GAP: 7 mmol/L (ref 4–13)
AST (SGOT): 25 U/L (ref 8–45)
BILIRUBIN TOTAL: 0.8 mg/dL (ref 0.3–1.3)
BUN/CREA RATIO: 14 (ref 6–22)
BUN: 14 mg/dL (ref 8–25)
CALCIUM: 9.2 mg/dL (ref 8.5–10.0)
CHLORIDE: 106 mmol/L (ref 96–111)
CO2 TOTAL: 23 mmol/L (ref 22–30)
CREATININE: 0.99 mg/dL (ref 0.60–1.05)
ESTIMATED GFR: 66 mL/min/BSA (ref >=60)
GLUCOSE: 89 mg/dL (ref 70–99)
POTASSIUM: 4 mmol/L (ref 3.5–5.1)
PROTEIN TOTAL: 7.7 g/dL (ref 6.4–8.3)
SODIUM: 136 mmol/L (ref 136–145)

## 2020-08-24 LAB — HGA1C (HEMOGLOBIN A1C WITH EST AVG GLUCOSE)
ESTIMATED AVERAGE GLUCOSE: 100 mg/dL
HEMOGLOBIN A1C: 5.1 % (ref 4.0–5.6)

## 2020-08-24 LAB — THYROID STIMULATING HORMONE (SENSITIVE TSH): TSH: 0.833 u[IU]/mL (ref 0.430–3.550)

## 2020-08-24 LAB — THYROXINE, FREE (FREE T4): THYROXINE (T4), FREE: 0.97 ng/dL (ref 0.70–1.25)

## 2020-08-24 MED ORDER — LOSARTAN 25 MG TABLET
25.0000 mg | ORAL_TABLET | Freq: Every day | ORAL | 1 refills | Status: DC
Start: 2020-08-24 — End: 2020-09-14

## 2020-08-24 NOTE — Progress Notes (Signed)
Subjective:     Patient ID:  Natasha Perez is an 49 y.o. female   Chief Complaint:    Chief Complaint   Patient presents with   . Annual Wellness Exam       HPI     The patient is new to our clinic and is here to establish care. She is also in need of a routine exam. Upon reviewing her chart, her last mammogram was on 12/20/2019 and was negative.    She states her BP has been elevated reading 157/115 over the past week. She has been having headaches. A doc she works with gave her Lisinopril 10 mg daily initially and increased it to 30 mg Lisinopril daily (20 mg in the morning and 10 mg at night). Her BP was averaging 130-140/80-90's. However, she has developed a dry cough. She denies CP, SOB, vision changes, palpitations, swelling, or URI symptoms. Her mother had HTN and DM.     Current Outpatient Medications   Medication Sig   . Ibuprofen (MOTRIN) 600 mg Oral Tablet Take 1 Tab (600 mg total) by mouth Three times a day as needed for Pain   . lisinopriL (PRINIVIL) 10 mg Oral Tablet Take 1 Tablet (10 mg total) by mouth Twice daily for 30 days   . multivitamin Tab take 1 Tab by mouth Once a day.     Allergies   Allergen Reactions   . Iodine And Iodide Containing Products Itching, Swelling and Hives/ Urticaria   . Sulfa (Sulfonamides) Nausea/ Vomiting     Past Medical History:   Diagnosis Date   . Cancer (CMS [place])    . Ectopic pregnancy     2005   . Melanoma (CMS [place]) 1999    right thigh, had 2 or 3 surgeries at [facility], sampled lymph nodes which were ok, still sees dermatologist yearly in [place] Use   . Smoking status: Never Smoker   . Smokeless tobacco: Never Used   Substance Use Topics   . Alcohol use: No     Alcohol/week: 0.0 standard drinks     Comment: rare   . Drug use: No     Family Medical History:     Problem Relation (Age of Onset)    Breast Cancer Maternal Aunt    Cancer Other    Diabetes Mother    Hypertension (High Blood Pressure) Mother    Lung Cancer Maternal  Grandmother    Stroke Mother (59)          Review of Systems   Constitutional: Negative.    HENT: Negative.    Eyes: Negative.    Respiratory: Negative.    Cardiovascular: Negative.    Gastrointestinal: Negative.    Endocrine: Negative.    Genitourinary: Negative.    Musculoskeletal: Negative.    Skin: Negative.    Neurological: Positive for headaches.   Psychiatric/Behavioral: Negative.      Objective:   Physical Exam  Vitals reviewed.   Constitutional:       Appearance: Normal appearance.   HENT:      Head: Normocephalic and atraumatic.      Nose: Nose normal.   Eyes:      Pupils: Pupils are equal, round, and reactive to light.   Neck:      Vascular: No carotid bruit.   Cardiovascular:      Rate and Rhythm: Normal rate and regular rhythm.  Pulses: Normal pulses.      Heart sounds: Normal heart sounds.   Pulmonary:      Effort: Pulmonary effort is normal.      Breath sounds: Normal breath sounds.   Musculoskeletal:         General: No swelling.      Cervical back: Normal range of motion.   Skin:     General: Skin is warm and dry.   Neurological:      General: No focal deficit present.      Mental Status: She is alert and oriented to person, place, and time.   Psychiatric:         Mood and Affect: Mood normal.         Behavior: Behavior normal.       Ortho Exam  Vitals:    08/24/20 1058   BP: 122/78   Pulse: 81   Resp: 16   Temp: 36.3 C (97.3 F)   SpO2: 97%   Weight: 115 kg (254 lb 6.6 oz)   Height: 1.791 m (5' 10.51")   BMI: 36.05       Assessment & Plan:     1.(Z00.00) Encounter for medical examination to establish care  Plan: See below.  To follow up in 2 weeks.  To call office sooner if questions or concerns arise.    2. (Z00.00) Encounter for annual physical exam  Plan: See below.  To follow up in 2 weeks.  To call office sooner if questions or concerns arise.     3. (Z13.220) Screening for cholesterol level  (primary encounter diagnosis)  Plan: Will obtain fasting lipid panel and will notify of  results.  To call office if questions or concerns arise.    4. (Z13.1) Screening for diabetes mellitus  Plan: Will obtain A1C and will notify of results.  To call office if questions or concerns arise.    5. (I10) HTN (hypertension)  Plan: Patient's BP has been elevated at home averaging 130-140/80-90's while taking 30 mg Lisinopril daily.  Due to cough, will discontinue Lisinopril and will begin Losartan 25 mg PO daily.  Will obtain CMP and CBC.  To follow low sodium low fat low caffeine diet.  To increase aerobic exercise aiming for 30 to 60 minutes a day 3 to 5 days a week.  To monitor BP at home daily and record readings.  To send me the readings to review in one to two weeks.  To call office sooner if sees consistent readings >140/90's.    6. (Z83.49) Family history of thyroid nodule  Plan: Will obtain TSH and free T4 as well as thyroid US due to questionable nodule on previous imaging per patient.  Will notify of results.  To call office if questions or concerns arise.    7. (Z13.29) Screening for thyroid disorder  Plan: Will obtain TSH and free T4.   Will notify of results.  To call office if questions or concerns arise.    Patient seen independently in clinic.    [address] PA-C    [place], [place]  08/24/2020, 12:48

## 2020-08-27 ENCOUNTER — Other Ambulatory Visit (HOSPITAL_BASED_OUTPATIENT_CLINIC_OR_DEPARTMENT_OTHER): Payer: Self-pay | Admitting: Physician Assistant

## 2020-08-27 DIAGNOSIS — R9389 Abnormal findings on diagnostic imaging of other specified body structures: Secondary | ICD-10-CM

## 2020-09-03 ENCOUNTER — Encounter (HOSPITAL_BASED_OUTPATIENT_CLINIC_OR_DEPARTMENT_OTHER): Payer: Self-pay | Admitting: Physician Assistant

## 2020-09-07 ENCOUNTER — Other Ambulatory Visit (HOSPITAL_BASED_OUTPATIENT_CLINIC_OR_DEPARTMENT_OTHER): Payer: Self-pay | Admitting: Physician Assistant

## 2020-09-09 ENCOUNTER — Ambulatory Visit
Payer: Medicare (Managed Care) | Attending: PEDIATRICS-PEDIATRIC GASTROENTEROLOGY | Admitting: PEDIATRICS-PEDIATRIC GASTROENTEROLOGY

## 2020-09-09 DIAGNOSIS — M549 Dorsalgia, unspecified: Secondary | ICD-10-CM | POA: Insufficient documentation

## 2020-09-09 NOTE — Progress Notes (Signed)
Procedure: Acupuncture    Indication: upper back pain    Procedure: Prior to the acupuncture treatment the risk, benefits and alternatives to acupuncture were discussed with the patient. Written consent was obtained.    Needles left in dispersion for 15 minutes- bilateral BL 10, GB 20, GB 21,   3 superficial needles placed in the area of greatest tenderness.  Cupping was briefly done to this area before the needles were placed.   BL 54 on the right, GV 14   Auricular needles placed in [place], Point zero and 3 in the shoulder region    Total time spent with patient, 15 minutes    Patience Musca, MD

## 2020-09-13 ENCOUNTER — Ambulatory Visit (HOSPITAL_BASED_OUTPATIENT_CLINIC_OR_DEPARTMENT_OTHER): Payer: Self-pay

## 2020-09-13 NOTE — Telephone Encounter (Signed)
Regarding: [place] Pt  \  medication request  ----- Message from [place] sent at 09/13/2020  2:21 PM EST -----  Lorn Junes, PA-C    Pt received a notice via MyChart from [facility] regarding a mixture medication of Losartan and Hydrochlorothiazide.  Pt stated they didn't see the notice until today, but agrees and is requesting the provider send the script to the [place] [facility] today.  Please advise.    Thank you so much for all your time and assistance!  Have a GREAT day!!!  --Estill Bamberg

## 2020-09-14 ENCOUNTER — Other Ambulatory Visit (HOSPITAL_BASED_OUTPATIENT_CLINIC_OR_DEPARTMENT_OTHER): Payer: Self-pay | Admitting: Physician Assistant

## 2020-09-14 DIAGNOSIS — I10 Essential (primary) hypertension: Secondary | ICD-10-CM

## 2020-09-14 MED ORDER — LOSARTAN 50 MG-HYDROCHLOROTHIAZIDE 12.5 MG TABLET
1.0000 | ORAL_TABLET | Freq: Every day | ORAL | 1 refills | Status: DC
Start: 2020-09-14 — End: 2020-10-12

## 2020-09-15 ENCOUNTER — Other Ambulatory Visit: Payer: Self-pay | Admitting: Physician Assistant

## 2020-10-04 ENCOUNTER — Other Ambulatory Visit (HOSPITAL_BASED_OUTPATIENT_CLINIC_OR_DEPARTMENT_OTHER): Payer: Self-pay | Admitting: Physician Assistant

## 2020-10-04 ENCOUNTER — Encounter (HOSPITAL_BASED_OUTPATIENT_CLINIC_OR_DEPARTMENT_OTHER): Payer: Self-pay | Admitting: Physician Assistant

## 2020-10-04 DIAGNOSIS — Z8619 Personal history of other infectious and parasitic diseases: Secondary | ICD-10-CM

## 2020-10-04 DIAGNOSIS — Z8742 Personal history of other diseases of the female genital tract: Secondary | ICD-10-CM

## 2020-10-04 DIAGNOSIS — I10 Essential (primary) hypertension: Secondary | ICD-10-CM

## 2020-10-04 MED ORDER — VALACYCLOVIR 500 MG TABLET
500.00 mg | ORAL_TABLET | Freq: Three times a day (TID) | ORAL | 2 refills | Status: AC
Start: 2020-10-04 — End: 2020-10-11

## 2020-10-04 MED ORDER — IBUPROFEN 600 MG TABLET
600.0000 mg | ORAL_TABLET | Freq: Three times a day (TID) | ORAL | 1 refills | Status: DC | PRN
Start: 2020-10-04 — End: 2020-10-12

## 2020-10-12 ENCOUNTER — Other Ambulatory Visit (HOSPITAL_BASED_OUTPATIENT_CLINIC_OR_DEPARTMENT_OTHER): Payer: Self-pay | Admitting: Physician Assistant

## 2020-10-12 DIAGNOSIS — I10 Essential (primary) hypertension: Secondary | ICD-10-CM

## 2020-10-12 DIAGNOSIS — Z8619 Personal history of other infectious and parasitic diseases: Secondary | ICD-10-CM

## 2020-10-12 MED ORDER — IBUPROFEN 800 MG TABLET
800.00 mg | ORAL_TABLET | Freq: Three times a day (TID) | ORAL | 1 refills | Status: DC | PRN
Start: 2020-10-12 — End: 2020-10-12

## 2020-10-12 MED ORDER — LOSARTAN 50 MG-HYDROCHLOROTHIAZIDE 12.5 MG TABLET
1.0000 | ORAL_TABLET | Freq: Every day | ORAL | 1 refills | Status: DC
Start: 2020-10-12 — End: 2020-10-12

## 2020-10-12 MED ORDER — VALACYCLOVIR 1 GRAM TABLET
1000.00 mg | ORAL_TABLET | Freq: Three times a day (TID) | ORAL | 0 refills | Status: DC
Start: 2020-10-12 — End: 2020-10-13

## 2020-10-12 NOTE — Telephone Encounter (Signed)
From: Clancy Gourd  To: Lorn Junes, PA-C  Sent: 10/04/2020 3:50 PM EST  Subject: prescription request    When I had my recent visit I forgot to ask for 2 prescription refills:  1) Valtrex 500mg  THREE times a day    or generic valacyclovir is fine as well   ###ask for TID because insurance is cheaper that way   use for fever blisters and I woke up with one and realized my prescription has no refills and I'm out!    2) Ibuprofen 800mg  THREE times day for menstrual cramps    Please send to [facility] in [place] and let me know when sent so I'm an schedule pick up.      Thanks so much for your time, [place]

## 2020-10-13 ENCOUNTER — Other Ambulatory Visit (HOSPITAL_BASED_OUTPATIENT_CLINIC_OR_DEPARTMENT_OTHER): Payer: Self-pay | Admitting: Physician Assistant

## 2020-10-13 DIAGNOSIS — Z8619 Personal history of other infectious and parasitic diseases: Secondary | ICD-10-CM

## 2020-10-13 MED ORDER — VALACYCLOVIR 500 MG TABLET
500.00 mg | ORAL_TABLET | Freq: Three times a day (TID) | ORAL | 0 refills | Status: DC
Start: 2020-10-13 — End: 2020-10-13

## 2020-10-14 ENCOUNTER — Ambulatory Visit (HOSPITAL_BASED_OUTPATIENT_CLINIC_OR_DEPARTMENT_OTHER): Payer: Self-pay | Admitting: "Endocrinology

## 2020-11-01 ENCOUNTER — Ambulatory Visit (HOSPITAL_COMMUNITY): Payer: Self-pay

## 2020-11-01 DIAGNOSIS — Z20828 Contact with and (suspected) exposure to other viral communicable diseases: Secondary | ICD-10-CM

## 2020-11-01 NOTE — Progress Notes (Signed)
COVID-19 SCREENING NOTE    Primary Care Provider: Lorn Junes, PA-C     Has the patient had recent travel to/from or exposure to someone with recent travel to a COVID-19 "hot spot"? 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 Yes  Has the patient had cough? Yes  Has the patient had shortness of breath? Yes  Additional symptoms? fatigue, sore throat     Patient Active Problem List   Diagnosis   . Melanoma (CMS [place])   . Fibroid   . Back pain   . Vitamin D deficiency   . Infertility management   . History of ectopic pregnancy         Occupation: Data Unavailable    Disposition:   SCREENING INDICATED.  Order to be placed. SCREENING SITE: [facility] .  Patient phone number and address confirmed: [phone], [place] [id number].      Verbal consent obtained from the patient to be contacted with result.    Dolores Lory, RN

## 2020-11-15 ENCOUNTER — Other Ambulatory Visit (HOSPITAL_BASED_OUTPATIENT_CLINIC_OR_DEPARTMENT_OTHER): Payer: Self-pay | Admitting: Student in an Organized Health Care Education/Training Program

## 2020-11-15 ENCOUNTER — Other Ambulatory Visit: Payer: Self-pay

## 2020-11-15 MED ORDER — ALBUTEROL SULFATE HFA 90 MCG/ACTUATION AEROSOL INHALER
2.0000 | INHALATION_SPRAY | RESPIRATORY_TRACT | 3 refills | Status: AC | PRN
Start: 2020-11-15 — End: 2020-12-15
  Filled 2020-11-15: qty 8.5, 17d supply, fill #0

## 2020-11-18 ENCOUNTER — Other Ambulatory Visit: Payer: Self-pay

## 2020-11-18 MED FILL — losartan 50 mg-hydrochlorothiazide 12.5 mg tablet: ORAL | 30 days supply | Qty: 30 | Fill #0 | Status: CN

## 2020-11-18 MED FILL — losartan 50 mg-hydrochlorothiazide 12.5 mg tablet: ORAL | 30 days supply | Qty: 30 | Fill #0 | Status: AC

## 2020-11-19 ENCOUNTER — Other Ambulatory Visit: Payer: Self-pay

## 2020-11-19 ENCOUNTER — Encounter (HOSPITAL_BASED_OUTPATIENT_CLINIC_OR_DEPARTMENT_OTHER): Payer: Self-pay | Admitting: Physician Assistant

## 2020-11-19 ENCOUNTER — Other Ambulatory Visit (HOSPITAL_BASED_OUTPATIENT_CLINIC_OR_DEPARTMENT_OTHER): Payer: Self-pay | Admitting: Physician Assistant

## 2020-11-19 DIAGNOSIS — Z1239 Encounter for other screening for malignant neoplasm of breast: Secondary | ICD-10-CM

## 2020-11-30 ENCOUNTER — Encounter (HOSPITAL_BASED_OUTPATIENT_CLINIC_OR_DEPARTMENT_OTHER): Payer: Self-pay | Admitting: Physician Assistant

## 2020-11-30 DIAGNOSIS — Z713 Dietary counseling and surveillance: Secondary | ICD-10-CM

## 2020-12-01 ENCOUNTER — Other Ambulatory Visit (HOSPITAL_BASED_OUTPATIENT_CLINIC_OR_DEPARTMENT_OTHER): Payer: Self-pay | Admitting: Physician Assistant

## 2020-12-17 ENCOUNTER — Other Ambulatory Visit: Payer: Self-pay

## 2020-12-17 ENCOUNTER — Other Ambulatory Visit (HOSPITAL_BASED_OUTPATIENT_CLINIC_OR_DEPARTMENT_OTHER): Payer: Self-pay | Admitting: Physician Assistant

## 2020-12-17 DIAGNOSIS — Z8619 Personal history of other infectious and parasitic diseases: Secondary | ICD-10-CM

## 2020-12-17 DIAGNOSIS — I10 Essential (primary) hypertension: Secondary | ICD-10-CM

## 2020-12-17 DIAGNOSIS — Z8742 Personal history of other diseases of the female genital tract: Secondary | ICD-10-CM

## 2020-12-17 DIAGNOSIS — Z8701 Personal history of pneumonia (recurrent): Secondary | ICD-10-CM

## 2020-12-17 MED ORDER — LOSARTAN 50 MG-HYDROCHLOROTHIAZIDE 12.5 MG TABLET
1.0000 | ORAL_TABLET | Freq: Every day | ORAL | 1 refills | Status: DC
Start: 2020-12-17 — End: 2021-07-13
  Filled 2020-12-20: qty 30, 30d supply, fill #0
  Filled 2021-01-19: qty 30, 30d supply, fill #1
  Filled 2021-02-26: qty 30, 30d supply, fill #2

## 2020-12-17 MED FILL — valACYclovir 500 mg tablet: ORAL | 9 days supply | Qty: 28 | Fill #0 | Status: AC

## 2020-12-17 MED FILL — ibuprofen 800 mg tablet: ORAL | 30 days supply | Qty: 90 | Fill #0 | Status: AC

## 2020-12-17 MED FILL — losartan 50 mg-hydrochlorothiazide 12.5 mg tablet: ORAL | 30 days supply | Qty: 30 | Fill #0 | Status: CN

## 2020-12-20 ENCOUNTER — Other Ambulatory Visit: Payer: Self-pay

## 2020-12-20 DIAGNOSIS — I10 Essential (primary) hypertension: Secondary | ICD-10-CM

## 2020-12-29 ENCOUNTER — Ambulatory Visit
Admission: RE | Admit: 2020-12-29 | Discharge: 2020-12-29 | Disposition: A | Discharge status: Home / Self Care | Payer: Medicare (Managed Care) | Source: Ambulatory Visit | Attending: Physician Assistant | Admitting: Physician Assistant

## 2020-12-29 ENCOUNTER — Other Ambulatory Visit: Payer: Self-pay

## 2020-12-29 ENCOUNTER — Encounter (HOSPITAL_BASED_OUTPATIENT_CLINIC_OR_DEPARTMENT_OTHER): Payer: Self-pay

## 2020-12-29 ENCOUNTER — Ambulatory Visit (HOSPITAL_BASED_OUTPATIENT_CLINIC_OR_DEPARTMENT_OTHER)
Admission: RE | Admit: 2020-12-29 | Discharge: 2020-12-29 | Disposition: A | Discharge status: Home / Self Care | Payer: Medicare (Managed Care) | Source: Ambulatory Visit

## 2020-12-29 DIAGNOSIS — Z8701 Personal history of pneumonia (recurrent): Secondary | ICD-10-CM

## 2020-12-29 DIAGNOSIS — Z1239 Encounter for other screening for malignant neoplasm of breast: Secondary | ICD-10-CM

## 2020-12-29 DIAGNOSIS — Z1231 Encounter for screening mammogram for malignant neoplasm of breast: Secondary | ICD-10-CM

## 2021-01-06 ENCOUNTER — Encounter (HOSPITAL_BASED_OUTPATIENT_CLINIC_OR_DEPARTMENT_OTHER): Payer: Self-pay | Admitting: Obstetrics & Gynecology

## 2021-01-06 ENCOUNTER — Other Ambulatory Visit: Payer: Self-pay

## 2021-01-06 ENCOUNTER — Ambulatory Visit: Payer: Medicare (Managed Care) | Attending: Obstetrics & Gynecology | Admitting: Obstetrics & Gynecology

## 2021-01-06 VITALS — BP 120/84 | Ht 70.0 in | Wt 257.1 lb

## 2021-01-06 DIAGNOSIS — Z124 Encounter for screening for malignant neoplasm of cervix: Secondary | ICD-10-CM | POA: Insufficient documentation

## 2021-01-06 DIAGNOSIS — N898 Other specified noninflammatory disorders of vagina: Secondary | ICD-10-CM | POA: Insufficient documentation

## 2021-01-06 DIAGNOSIS — Z0000 Encounter for general adult medical examination without abnormal findings: Secondary | ICD-10-CM | POA: Insufficient documentation

## 2021-01-06 DIAGNOSIS — Z803 Family history of malignant neoplasm of breast: Secondary | ICD-10-CM

## 2021-01-06 DIAGNOSIS — Z01419 Encounter for gynecological examination (general) (routine) without abnormal findings: Secondary | ICD-10-CM

## 2021-01-06 DIAGNOSIS — Z1239 Encounter for other screening for malignant neoplasm of breast: Secondary | ICD-10-CM | POA: Insufficient documentation

## 2021-01-06 DIAGNOSIS — Z780 Asymptomatic menopausal state: Secondary | ICD-10-CM

## 2021-01-06 LAB — WETMOUNT
CLUE CELLS: ABSENT
TRICHOMONAS: ABSENT
YEAST: ABSENT

## 2021-01-06 NOTE — Addendum Note (Signed)
Addended by: Dyann Ruddle on: 01/06/2021 06:09 PM     Modules accepted: Orders

## 2021-01-06 NOTE — Addendum Note (Signed)
Addended by: Dyann Ruddle on: 01/06/2021 06:03 PM     Modules accepted: Orders

## 2021-01-06 NOTE — Progress Notes (Signed)
[facility]  Ob/Gyn Outpatient Visit    Subjective:   Natasha Perez is a 50 y.o. G28P0010 female who presents for annual gyn exam.   The patient has no complaint today.  The patient is sexually active.  No vaginal bleeding or discharge,   No urinary or gastrointestinal symptoms.     OB History:  OB History     Gravida   1    Para   0    Term   0    Preterm        AB   1    Living           SAB        IAB        Ectopic   1    Multiple        Live Births                      Gyn History:  Menarche age: 60  Menstrual History: Patient's last menstrual period was 12/25/2020., regular, Flow is Medium  Dysmennorhea No,  Intermenstrual bleeding No.   Contraception: none  Last Pap Smear:normal December/2014   Cervical Procedure: No   STD History: No   Sexual HX:  Sexually active, dyspareunia No    Past Medical History:  Past Medical History:   Diagnosis Date    Cancer (CMS [place])     Ectopic pregnancy     2005    Melanoma (CMS [place]) 1999    right thigh, had 2 or 3 surgeries at [facility], sampled lymph nodes which were ok, still sees dermatologist yearly in [place]           Current Medications:  Ibuprofen (MOTRIN) 800 mg Oral Tablet, TAKE ONE TABLET BY MOUTH 3 TIMES A DAY AS NEEDED FOR PAIN  losartan-hydrochlorothiazide (HYZAAR) 50-12.5 mg Oral Tablet, TAKE ONE TABLET BY MOUTH ONCE DAILY.  multivitamin Tab, take 1 Tab by mouth Once a day. (Patient not taking: Reported on 01/06/2021)  valACYclovir (VALTREX) 500 mg Oral Tablet, TAKE 1 TABLET BY MOUTH THREE TIMES A DAY    No facility-administered medications prior to visit.      Surgical History  Past Surgical History:   Procedure Laterality Date    HX BREAST BIOPSY Right     needle bx.  age  52    benign    HX MYOMECTOMY  2005    HX OTHER      3 right leg surgeries for melanoma    HX REFRACTIVE SURGERY  2000         Family History:  Family Medical History:     Problem Relation (Age of Onset)    Breast Cancer Maternal Aunt    Cancer Other    Diabetes  Mother    Hypertension (High Blood Pressure) Mother    Lung Cancer Maternal Grandmother    Stroke Mother (59)             Allergies:  Allergies   Allergen Reactions    Iodine And Iodide Containing Products Itching, Swelling and Hives/ Urticaria    Sulfa (Sulfonamides) Nausea/ Vomiting       Social History:  Social History     Tobacco Use    Smoking status: Never Smoker    Smokeless tobacco: Never Used   Substance Use Topics    Alcohol use: No     Alcohol/week: 0.0 standard drinks     Comment: rare  Drug use: No       Concern     Drug Use No     Marital Status: Single     Abuse/Domestic Violence No       Review of Systems:  Constitutional: negative  Respiratory: negative  Cardiovascular: negative  Gastrointestinal: negative  Genitourinary:negative  Integument/breast: negative  Musculoskeletal:negative  Neurological: negative  Behavioral/Psych: negative       Objective:   Vitals: BP 120/84    Ht 1.778 m (5\' 10" )    Wt 117 kg (257 lb 0.9 oz)    LMP 12/25/2020    BMI 36.88 kg/m       General: appears in good health and moderately obese  Lymph nodes: Cervical, supraclavicular, and axillary nodes normal  Lungs: clear to auscultation and percussion bilaterally.   Cardiovascular:    Heart regular rate and rhythm without murmer  Abdomen: soft, non-tender, non-distended, no hepatosplenomegaly, no masses, no hernias, no CVA tenderness and well healed scar  Breast exam: normal mammogram 12/29/2020  Genitalia: normal female, without lesion discharge or tenderness  Pelvic exam: exam chaperoned by nurse.                        Vulva normal female genitalia, no lesions, normal female hair distribution                       GU  normal urethral meatus, normal urethra, no cystocele                       Vagina  normal mucosa, curd-like discharge, no  lesions                       Cervix  no lesions, no cervical motion tenderness, no bleeding following Pap                       Uterus  normal shape, position and consistency                        Left Adnxa Not palpable, Non tender                      Right Adnexa:Not palpable, Non tender                      Rectovaginal:Confirms  Extremities: extremities normal, atraumatic, no cyanosis or edema      Assessment:   Natasha Perez is a 50 y.o. female G1P0010 who presents for annual gyn visit.  Healthy female exam.  Last mammogram was 2022 and was neg  Last Pap smear was 2014 and was neg       2 ICD-10-CM    1. Annual physical exam  Z00.00    2. Screening for cervical cancer  Z12.4    3. Encounter for screening for malignant neoplasm of breast, unspecified screening modality  Z12.39              Plan:   Pap Smear and STD testing were done.  Discussed healthy lifestyle modifications.  Mammogram was scheduled.  We will call the patient with the results.  All questions answered.  The patient will call with any additional questions.      Bluford Main, MD 01/06/2021, 17:53

## 2021-01-11 ENCOUNTER — Other Ambulatory Visit (HOSPITAL_BASED_OUTPATIENT_CLINIC_OR_DEPARTMENT_OTHER): Payer: Self-pay | Admitting: Physician Assistant

## 2021-01-11 DIAGNOSIS — Z713 Dietary counseling and surveillance: Secondary | ICD-10-CM

## 2021-01-17 ENCOUNTER — Encounter (INDEPENDENT_AMBULATORY_CARE_PROVIDER_SITE_OTHER): Payer: Self-pay

## 2021-01-17 ENCOUNTER — Encounter (HOSPITAL_COMMUNITY): Payer: Self-pay

## 2021-01-19 ENCOUNTER — Other Ambulatory Visit: Payer: Self-pay

## 2021-01-19 DIAGNOSIS — I10 Essential (primary) hypertension: Secondary | ICD-10-CM

## 2021-01-20 IMAGING — DX LEFT ELBOW - COMPLETE 3+ VIEW
4 series · 4 of 4 positions shown · non-contrast
Comparison: None.

CLINICAL DATA: Fall last [REDACTED]

EXAM:
LEFT ELBOW - COMPLETE 3+ VIEW

[elbow ap]
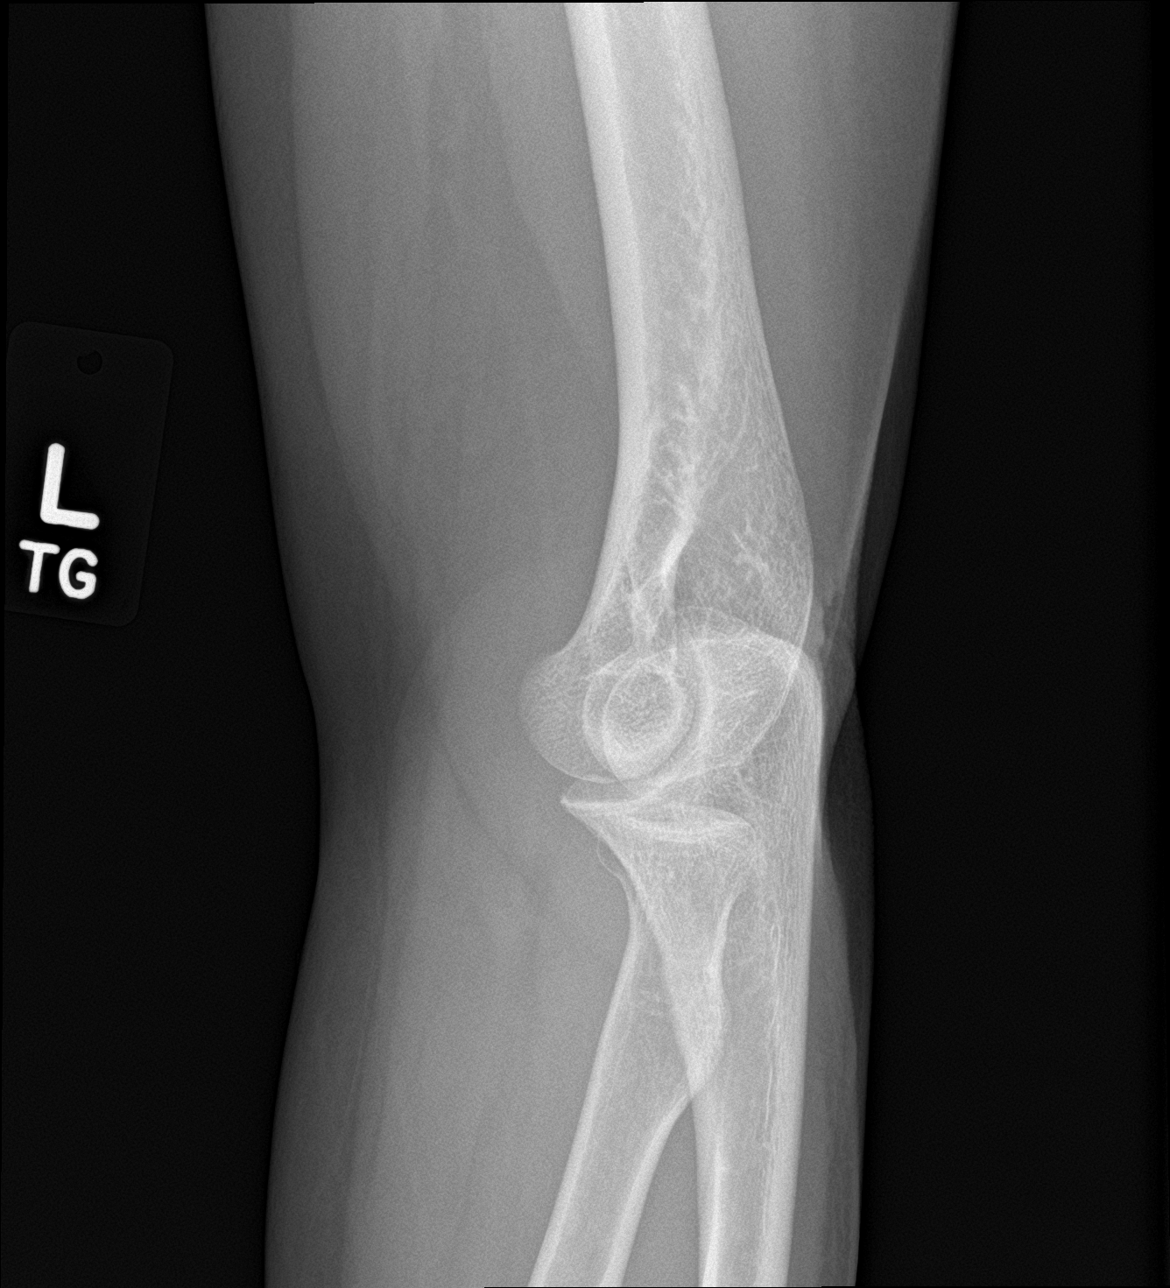

[elbow lat]
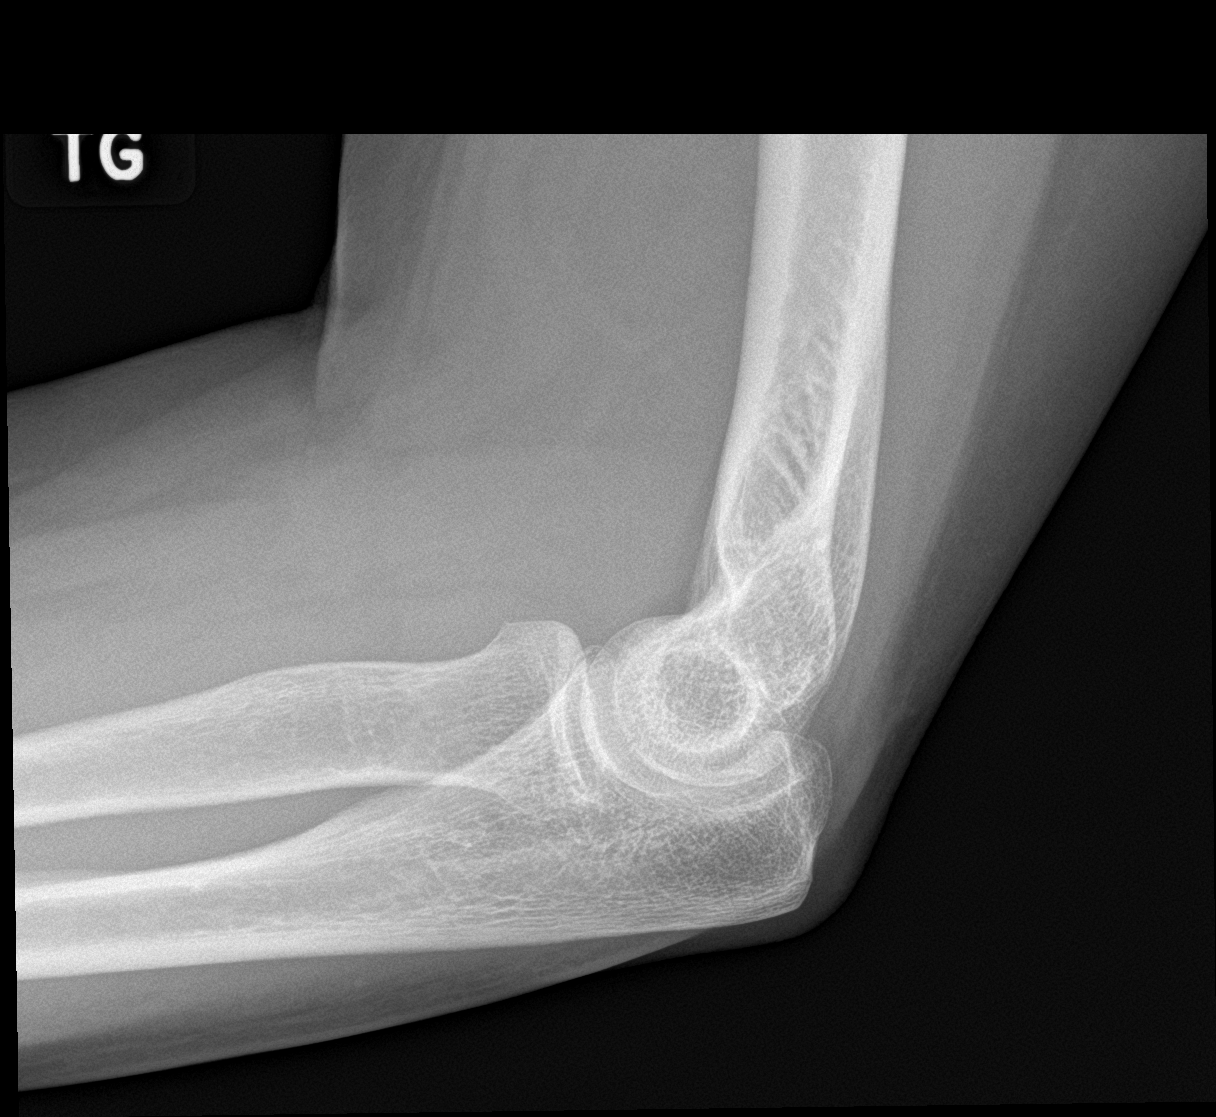

[elbow obl (1 of 2)]
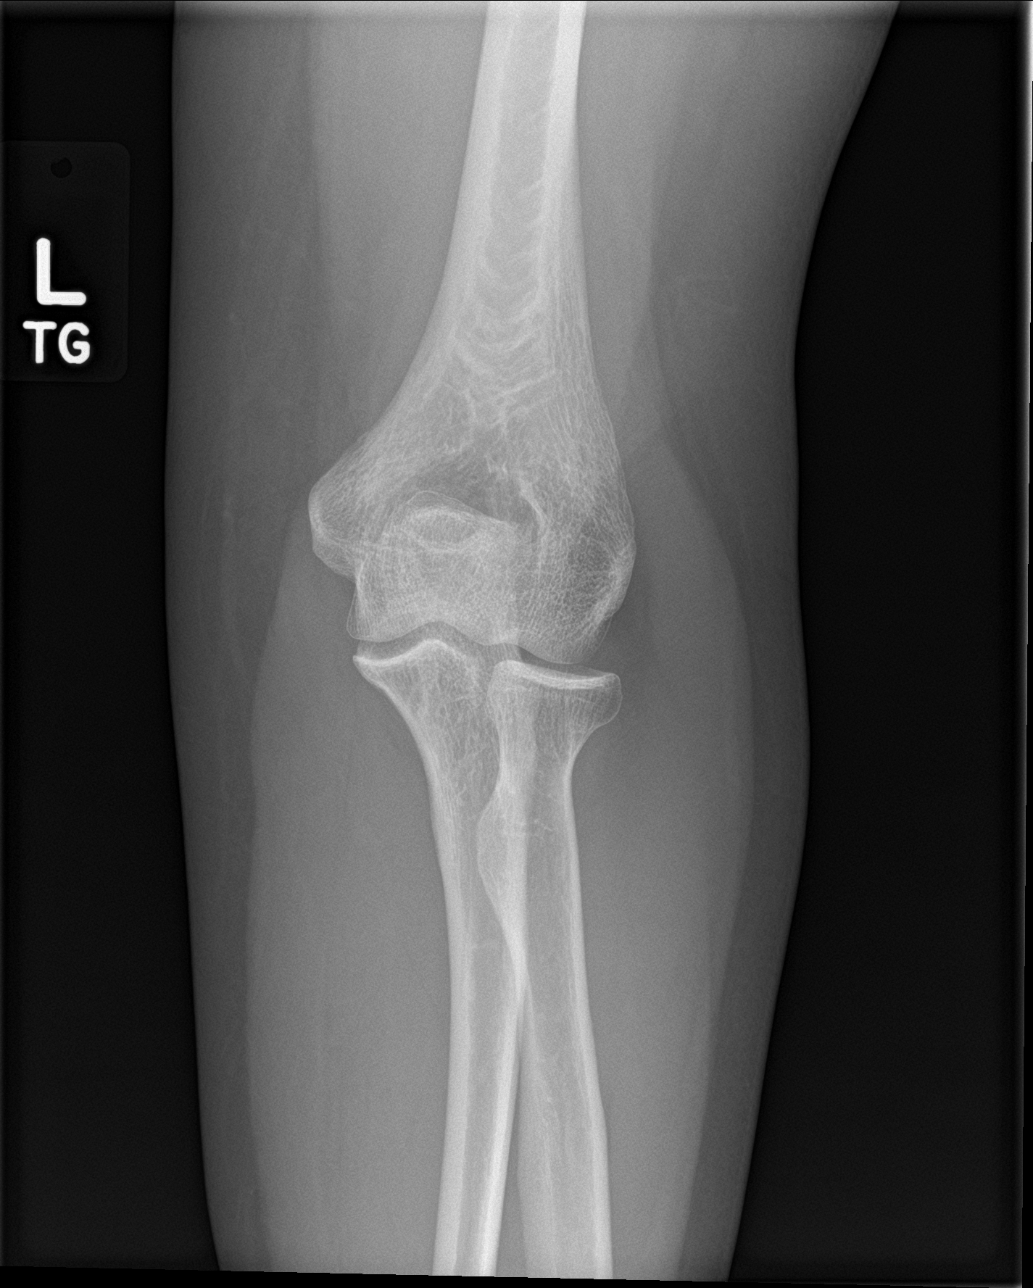

[elbow obl (2 of 2)]
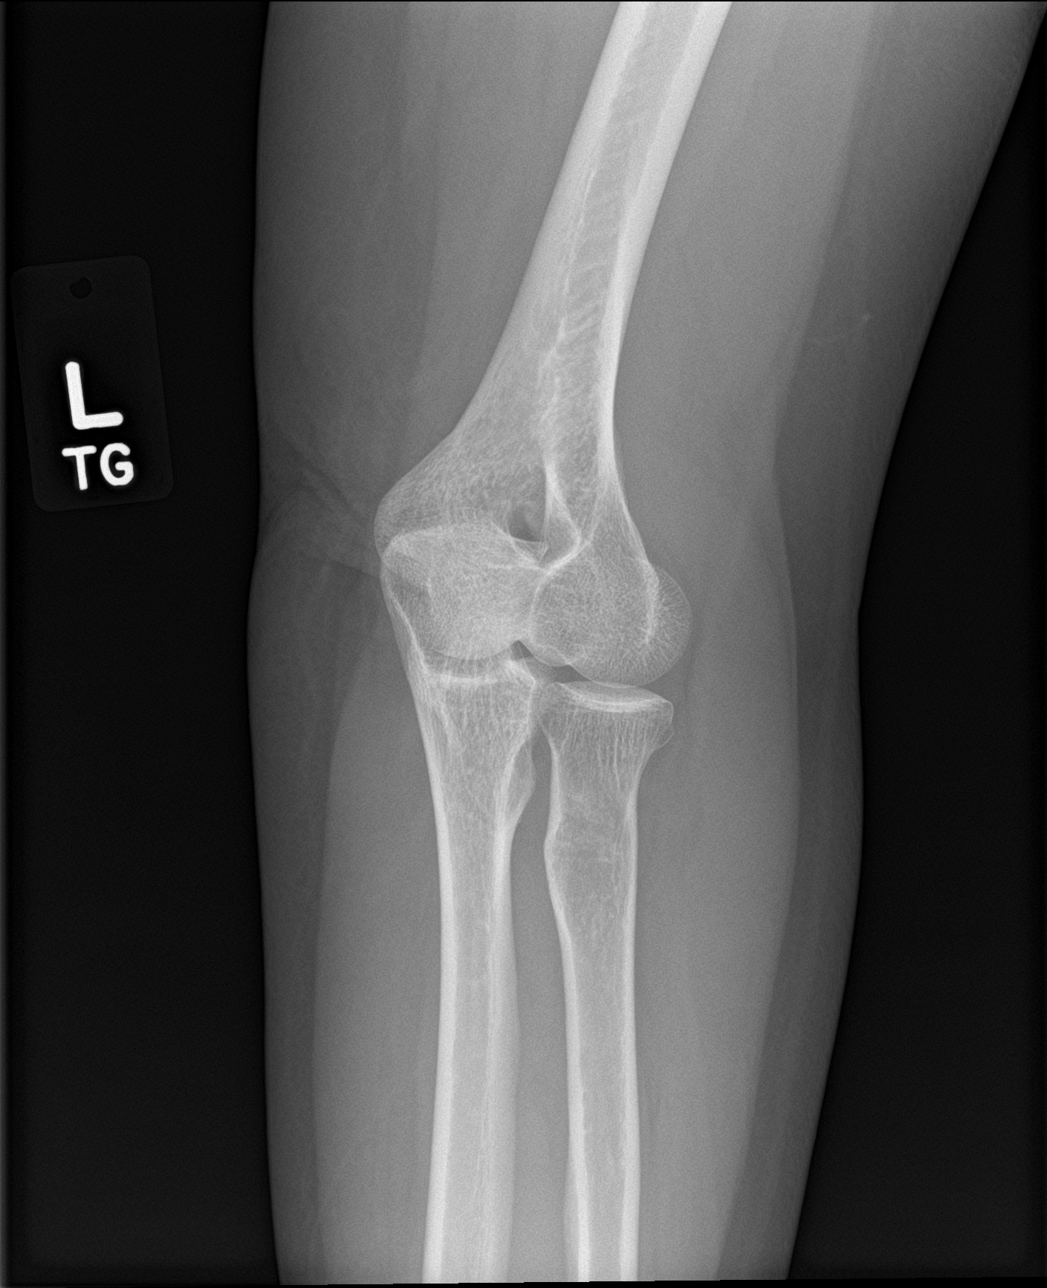

[4 of 4 positions shown; findings below may reference images not displayed]

FINDINGS: No acute fracture. No dislocation.  Unremarkable soft tissues.
IMPRESSION: No acute bony pathology.

## 2021-01-20 IMAGING — DX LEFT SHOULDER - 2+ VIEW
3 series · 3 of 3 positions shown · non-contrast
Comparison: None.

CLINICAL DATA: Fall

EXAM:
LEFT SHOULDER - 2+ VIEW

[shoulder grashey]
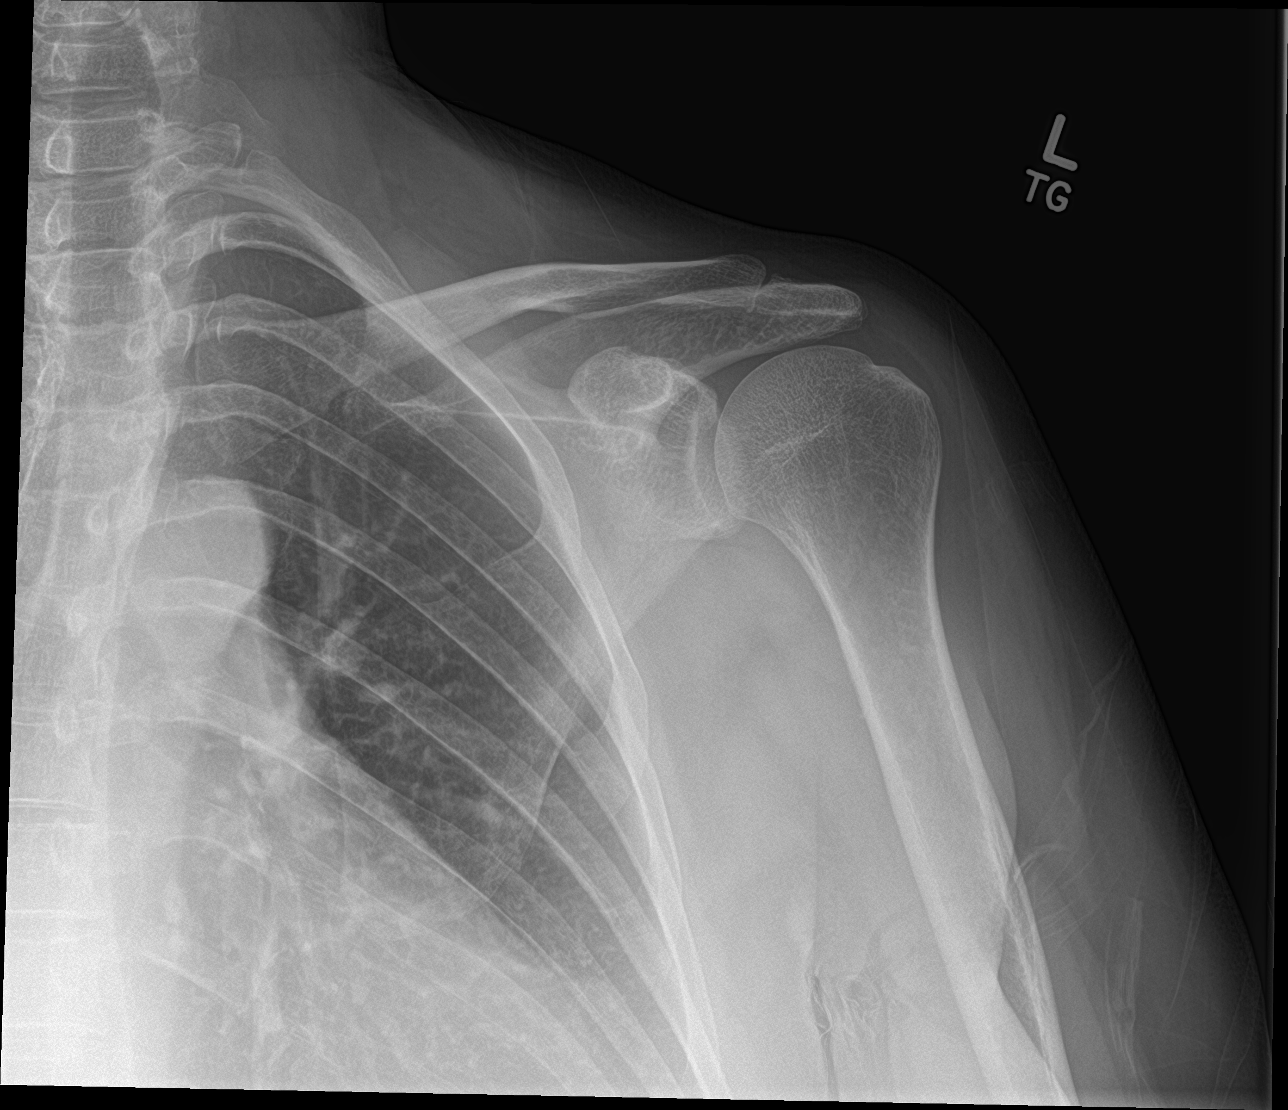

[shoulder y view]
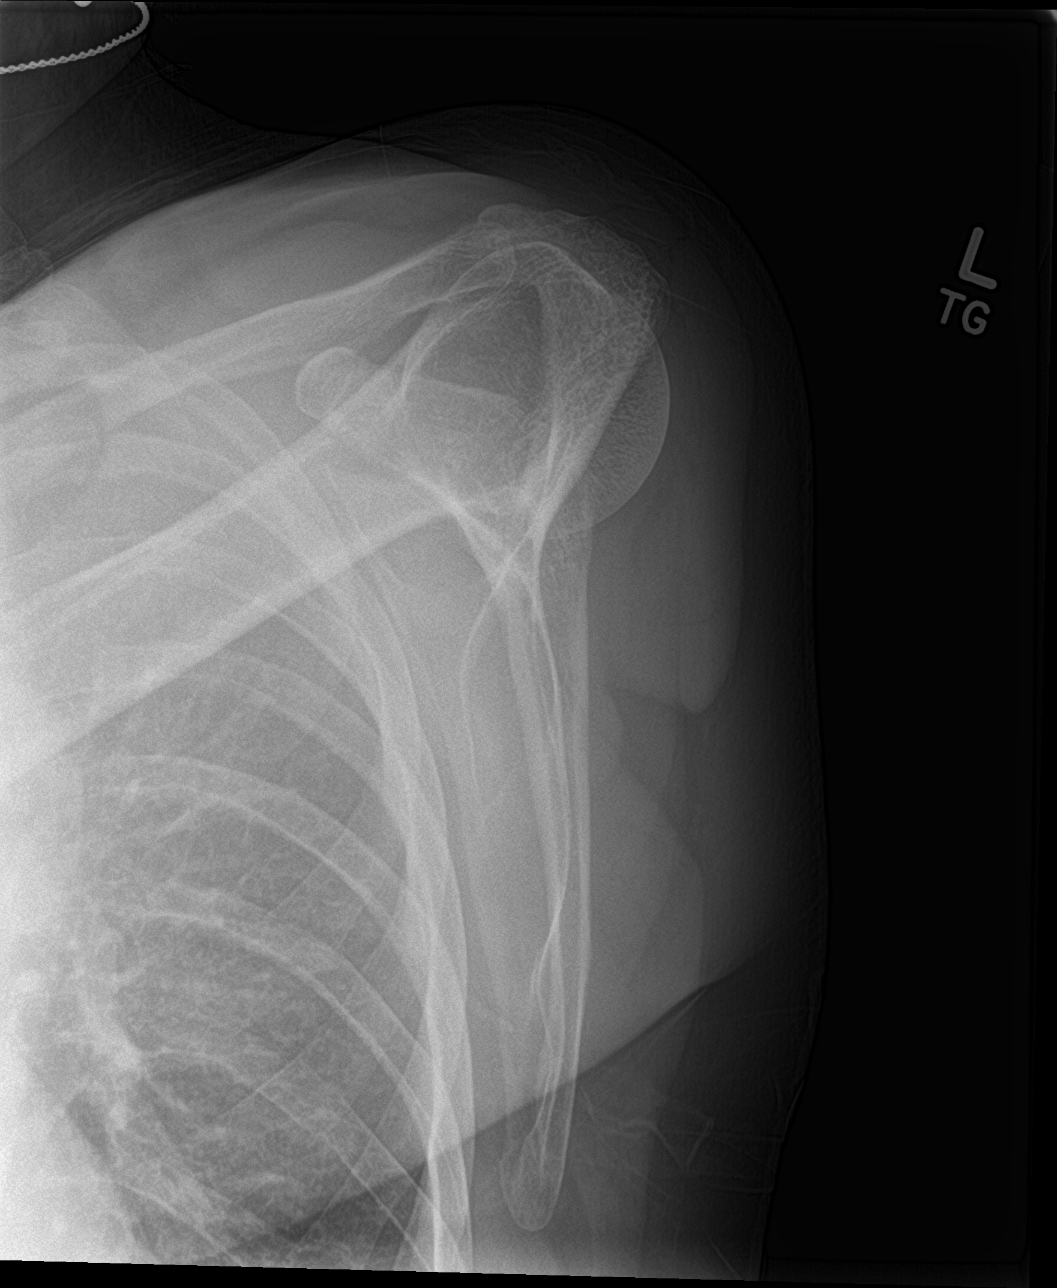

[shoulder axillary]
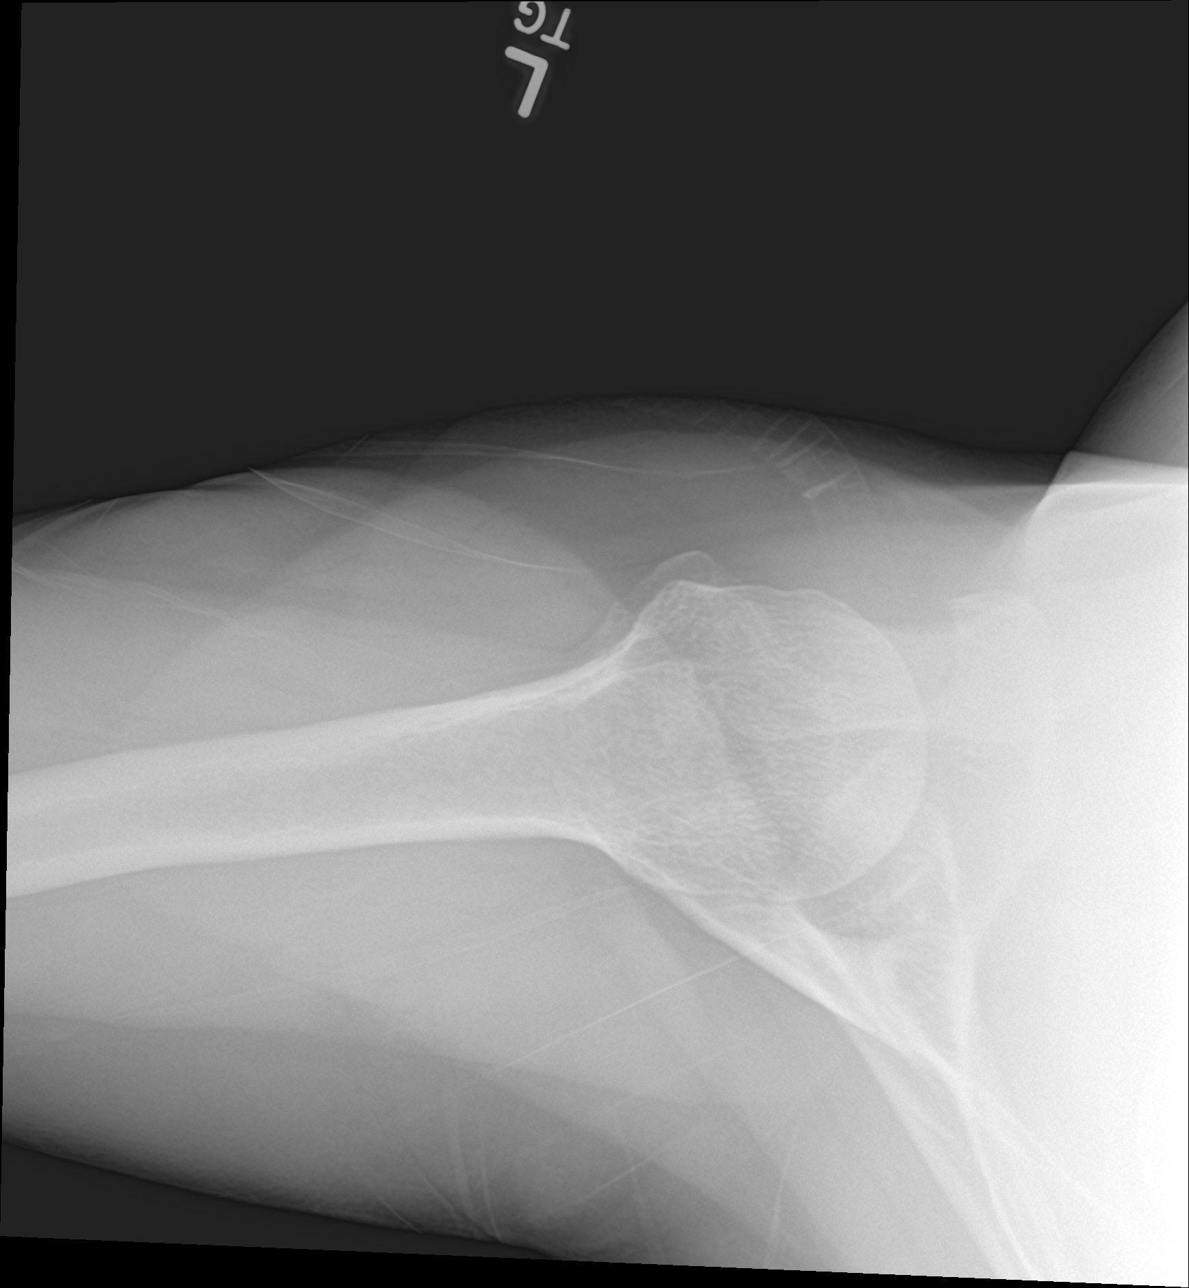

[3 of 3 positions shown; findings below may reference images not displayed]

FINDINGS: No acute fracture. No dislocation.  Unremarkable soft tissues.
IMPRESSION: No acute bony pathology.

## 2021-01-21 ENCOUNTER — Other Ambulatory Visit: Payer: Self-pay

## 2021-01-31 LAB — CYTOPATHOLOGY, GYN +/- HIGH RISK HPV

## 2021-02-01 LAB — HUMAN PAPILLOMA VIRUS (HPV) BY PCR WITH HIGH RISK GENOTYPING (THINPREP)
HPV OTHER: NEGATIVE
HPV16 PCR: NEGATIVE
HPV18 PCR: NEGATIVE

## 2021-02-04 ENCOUNTER — Encounter (INDEPENDENT_AMBULATORY_CARE_PROVIDER_SITE_OTHER): Payer: Self-pay

## 2021-02-07 ENCOUNTER — Other Ambulatory Visit (INDEPENDENT_AMBULATORY_CARE_PROVIDER_SITE_OTHER): Payer: Medicare (Managed Care)

## 2021-02-07 ENCOUNTER — Ambulatory Visit (INDEPENDENT_AMBULATORY_CARE_PROVIDER_SITE_OTHER): Payer: Medicare (Managed Care)

## 2021-02-07 ENCOUNTER — Other Ambulatory Visit: Payer: Self-pay

## 2021-02-07 ENCOUNTER — Encounter (INDEPENDENT_AMBULATORY_CARE_PROVIDER_SITE_OTHER): Payer: Self-pay

## 2021-02-07 VITALS — BP 135/98 | HR 76 | Temp 99.2°F | Resp 16 | Ht 70.0 in | Wt 262.0 lb

## 2021-02-07 DIAGNOSIS — M7918 Myalgia, other site: Secondary | ICD-10-CM

## 2021-02-07 DIAGNOSIS — W1789XA Other fall from one level to another, initial encounter: Secondary | ICD-10-CM

## 2021-02-07 DIAGNOSIS — M79671 Pain in right foot: Secondary | ICD-10-CM

## 2021-02-07 DIAGNOSIS — S93491A Sprain of other ligament of right ankle, initial encounter: Secondary | ICD-10-CM

## 2021-02-07 DIAGNOSIS — W19XXXA Unspecified fall, initial encounter: Secondary | ICD-10-CM

## 2021-02-07 DIAGNOSIS — M25521 Pain in right elbow: Secondary | ICD-10-CM

## 2021-02-07 DIAGNOSIS — S59901A Unspecified injury of right elbow, initial encounter: Secondary | ICD-10-CM

## 2021-02-07 DIAGNOSIS — M7671 Peroneal tendinitis, right leg: Secondary | ICD-10-CM

## 2021-02-07 DIAGNOSIS — M25571 Pain in right ankle and joints of right foot: Secondary | ICD-10-CM

## 2021-02-07 DIAGNOSIS — S8991XA Unspecified injury of right lower leg, initial encounter: Secondary | ICD-10-CM

## 2021-02-07 DIAGNOSIS — S99911A Unspecified injury of right ankle, initial encounter: Secondary | ICD-10-CM

## 2021-02-07 DIAGNOSIS — S99921A Unspecified injury of right foot, initial encounter: Secondary | ICD-10-CM

## 2021-02-07 DIAGNOSIS — S86811A Strain of other muscle(s) and tendon(s) at lower leg level, right leg, initial encounter: Secondary | ICD-10-CM

## 2021-02-07 MED ORDER — DICLOFENAC SODIUM 75 MG TABLET,DELAYED RELEASE
75.0000 mg | DELAYED_RELEASE_TABLET | Freq: Two times a day (BID) | ORAL | 0 refills | Status: DC
Start: 2021-02-07 — End: 2021-07-13
  Filled 2021-02-07: qty 60, 30d supply, fill #0

## 2021-02-07 NOTE — Progress Notes (Signed)
History of Present Illness: Natasha Perez is a 50 y.o. female who [facility] with chief complaint of    Chief Complaint            Ankle Pain right ankle and right calf pain after falling from roof x 1 week    Tailbone Pain after fall x 1 week        Pt presents to [place] today with c/o right foot/ankle pain onset 1 week. Pt reports she fell off of her RV, which was about a 10 foot fall. Pt says she caught her arm on the ladder, twisted her right ankle, and fell on some stones. Pt sts she feels like her elbow has improved. Pt associates ecchymosis, right elbow pain, right lower extremity pain, and tail bone pain. Pt denies paresthesias.    I reviewed and confirmed the patient's past medical history taken by the nurse or medical assistant with the addition of the following:    Past Medical History:    Past Medical History:   Diagnosis Date   . Cancer (CMS [place])    . Ectopic pregnancy     2005   . Melanoma (CMS [place]) 1999    right thigh, had 2 or 3 surgeries at [facility], sampled lymph nodes which were ok, still sees dermatologist yearly in [place]       Past Surgical History:    Past Surgical History:   Procedure Laterality Date   . Hx breast biopsy Right    . Hx myomectomy  2005   . Hx other     . Hx refractive surgery  2000     Allergies:  Allergies   Allergen Reactions   . Iodine And Iodide Containing Products Itching, Swelling and Hives/ Urticaria   . Sulfa (Sulfonamides) Nausea/ Vomiting     Medications:    Current Outpatient Medications   Medication Sig   . diclofenac sodium (VOLTAREN) 75 mg Oral Tablet, Delayed Release (E.C.) Take 1 Tablet (75 mg total) by mouth Twice daily   . losartan-hydrochlorothiazide (HYZAAR) 50-12.5 mg Oral Tablet TAKE ONE TABLET BY MOUTH ONCE DAILY.   . multivitamin Tab take 1 Tab by mouth Once a day. (Patient not taking: Reported on 01/06/2021)   . valACYclovir (VALTREX) 500 mg Oral Tablet TAKE 1 TABLET BY MOUTH THREE TIMES A DAY     Social History:     Social History     Tobacco Use   . Smoking status: Never Smoker   . Smokeless tobacco: Never Used   Vaping Use   . Vaping Use: Never used   Substance Use Topics   . Alcohol use: No     Alcohol/week: 0.0 standard drinks     Comment: rare   . Drug use: No     Family History:  Family Medical History:     Problem Relation (Age of Onset)    Breast Cancer Maternal Aunt    Cancer Other    Diabetes Mother    Hypertension (High Blood Pressure) Mother    Lung Cancer Maternal Grandmother    Stroke Mother (60)          Review of Systems:  Musculoskeletal: right ankle/foot, right lower extremity, right elbow, and tailbone injury  Neurologic: right ankle/foot, right lower extremity, right elbow, and tailbone pain and no paresthesias  Skin: ecchymosis   All other review of systems were negative    Physical Exam:  Vital signs:   Vitals:  02/07/21 1750   BP: (!) 135/98   Pulse: 76   Resp: 16   Temp: 37.3 C (99.2 F)   TempSrc: Tympanic   SpO2: 98%   Weight: 119 kg (262 lb)   Height: 1.778 m (5\' 10" )   BMI: 37.67       Body mass index is 37.59 kg/m. Facility age limit for growth percentiles is 20 years.  Patient's last menstrual period was 01/20/2021 (approximate).    General:  Well appearing and No acute distress  Eyes:  Normal lids/lashes, normal conjunctiva  Pulmonary:  No respiratory distress  Skin:  warm/dry and no rash  Musculoskeletal: Point tender over the mid line of the sacrum, and mildly over the paraspinal musculature and lumbar area, elbow has scattered bruising that appears to be healing, FROM, strength is intact, right lower extremity very tender over the muscle belly of the tibia anterior, tender over the peroneal tendon and ATFL, mild diffuse TTP over mortise area, resisted ankle dorsiflexion and eversion with significant discomfort, anterior drawer and talar tilt testing positive, ROM testing with dorsiflexion and eversion deficits   Psychiatric:  Appropriate affect and behavior and Normal speech  Neurologic:    Alert and oriented x 3    Data Reviewed:    Radiography:  Right foot, ankle, and tibia-fibula, Right elbow, and Sacrum-Coccyx X-Ray Reviewed by Dr. Tamela Oddi, DO, on 02/07/2021 at 19:10.  Right Tibia-fibula, ankle, and foot X-Ray results showed no evidence of fractures, and some soft tissue edema on lateral ankle. Right Elbow X-Ray results revealed subtle likely mason 1 fracture of the radial head, with some soft tissue edema. Sacrum X-Ray results showed no obvious fractures seen.     Course: Condition at discharge: Good     Differential Diagnosis: Ankle sprain vs Bone contusion vs Fracture    Assessment:   1. Fall, initial encounter    2. Buttock pain    3. Right elbow pain    4. Acute right ankle pain    5. Right foot pain    6. Peroneal tendinitis of right lower extremity    7. Sprain of anterior talofibular ligament of right ankle, initial encounter    8. Strain of right tibialis anterior muscle, initial encounter      Plan:    Orders Placed This Encounter   . Right Foot X-ray   . Right Ankle x-ray   . Right Tibia-Fibula X-ray   . Right Elbow X-ray   . Sacrum and coccyx X-ray   . CANCELED: Refer to [facility] Orthopa[facility]/Sports Medicine   . Refer to [facility] Orthopaedics/Sports Medicine   . diclofenac sodium (VOLTAREN) 75 mg Oral Tablet, Delayed Release (E.C.)   . DME - [facility]     See data reviewed for X-Ray results.  Suspect bone contusion of sacrum, subtle but likely Mason I radial head fracture (that she is moving well), tib anterior strain (actually is most painful of all injuries), peroneal tendon strain, and ATFL sprain.  She was given tall CAM in clinic today.  Pt has crutches at home from prior injury that she will be using.  Discussed ankle ABCs, icing and elevation for edema management.  Rx sent in for PO Diclofenac to use for pain.  Instructed to continue movement of elbow to prevent stiffness and to be cautious to avoid further injury.  Can use pillow to sit on for sacrum.  Making sports med  referral for about 2 weeks from now to ensure these are follow up  on and she is progressing.    Go to Emergency Department immediately for further work up if any concerning symptoms.  Plan was discussed and patient verbalized understanding. If symptoms are worsening or not improving the patient should return to the urgent care for further evaluation.    I am scribing for, and in the presence of, Dr. Tamela Oddi, DO for services provided on 02/07/2021.  Julien Nordmann, [place], [place] 02/07/2021, 17:57    Tamela Oddi, DO  02/07/2021, 19:06

## 2021-02-09 ENCOUNTER — Other Ambulatory Visit: Payer: Self-pay

## 2021-02-09 ENCOUNTER — Ambulatory Visit: Payer: Medicare (Managed Care) | Attending: Family | Admitting: Family

## 2021-02-09 ENCOUNTER — Encounter (HOSPITAL_BASED_OUTPATIENT_CLINIC_OR_DEPARTMENT_OTHER): Payer: Self-pay | Admitting: Family

## 2021-02-09 DIAGNOSIS — D229 Melanocytic nevi, unspecified: Secondary | ICD-10-CM | POA: Insufficient documentation

## 2021-02-09 DIAGNOSIS — D2239 Melanocytic nevi of other parts of face: Secondary | ICD-10-CM

## 2021-02-09 DIAGNOSIS — Z85820 Personal history of malignant melanoma of skin: Secondary | ICD-10-CM

## 2021-02-09 DIAGNOSIS — D2272 Melanocytic nevi of left lower limb, including hip: Secondary | ICD-10-CM

## 2021-02-09 DIAGNOSIS — L821 Other seborrheic keratosis: Secondary | ICD-10-CM

## 2021-02-09 DIAGNOSIS — L918 Other hypertrophic disorders of the skin: Secondary | ICD-10-CM | POA: Insufficient documentation

## 2021-02-09 DIAGNOSIS — L570 Actinic keratosis: Secondary | ICD-10-CM | POA: Insufficient documentation

## 2021-02-09 DIAGNOSIS — D2222 Melanocytic nevi of left ear and external auricular canal: Secondary | ICD-10-CM

## 2021-02-09 NOTE — Procedures (Signed)
See progress note    Jamas Lav, APRN,FNP-BC  02/09/2021, 09:21

## 2021-02-09 NOTE — Progress Notes (Signed)
Dermatology Clinic, [facility]  [place] [address]  [phone]    Date:   02/09/2021  Name: Natasha Perez  Age: 50 y.o.    Chief complaint: Skin Lesions      HPI    Natasha Perez is a 50 y.o.    White  Other female with a history of melanoma s/p excision in 1999 presenting for multiple  Skin Lesions. Patient states she has skin tags on her neck, left axilla, and left medial thigh. She is also concerned about a lesion on her chest that has been present for several months. Lastly she is concerned about a rough lesion on her left cheek that has been present for about 6 months. States all lesions are asymptomatic. Here for treatment. No additional skin-related complaints. Careful in sun.         Review of Systems   Constitutional: Negative for chills and fever.   Skin: Negative for itching and rash.       Current Medications  Outpatient Encounter Medications as of 02/09/2021   Medication Sig Dispense Refill    diclofenac sodium (VOLTAREN) 75 mg Oral Tablet, Delayed Release (E.C.) Take 1 Tablet (75 mg total) by mouth Twice daily 60 Tablet 0    losartan-hydrochlorothiazide (HYZAAR) 50-12.5 mg Oral Tablet TAKE ONE TABLET BY MOUTH ONCE DAILY. 90 Tablet 1    multivitamin Tab take 1 Tab by mouth Once a day. (Patient not taking: No sig reported)  0    valACYclovir (VALTREX) 500 mg Oral Tablet TAKE 1 TABLET BY MOUTH THREE TIMES A DAY 90 Tablet 0    [DISCONTINUED] Ibuprofen (MOTRIN) 800 mg Oral Tablet TAKE ONE TABLET BY MOUTH 3 TIMES A DAY AS NEEDED FOR PAIN 90 Tablet 1    [DISCONTINUED] lisinopriL (PRINIVIL) 10 mg Oral Tablet Take 1 Tablet (10 mg total) by mouth Twice daily for 30 days 90 Tablet 3    [DISCONTINUED] losartan (COZAAR) 25 mg Oral Tablet Take 1 Tablet (25 mg total) by mouth Once a day 90 Tablet 1     No facility-administered encounter medications on file as of 02/09/2021.        Allergies   Allergen Reactions    Iodine And Iodide Containing Products  Itching, Swelling and Hives/ Urticaria    Sulfa (Sulfonamides) Nausea/ Vomiting        Past Medical History:   Diagnosis Date    Cancer (CMS [facility])     Ectopic pregnancy     2005    Melanoma (CMS [facility]) 1999    right thigh, had 2 or 3 surgeries at [facility], sampled lymph nodes which were ok, still sees dermatologist yearly in [facility]            Patient Active Problem List    Diagnosis Date Noted    Infertility management 08/12/2013    History of ectopic pregnancy 08/12/2013    Melanoma (CMS [place]) 05/27/2010    Fibroid 05/27/2010    Back pain 05/27/2010    Vitamin D deficiency 05/27/2010         Physical Exam  Vitals:    BP: ()/()    There were no vitals filed for this visit.       Physical Exam  Constitutional:       General: She is not in acute distress.     Appearance: Normal appearance.   HENT:      Head:     Eyes:  General: Lids are normal.   Skin:     General: Skin is warm and dry.      Coloration: Skin is not pale.          Neurological:      Mental Status: She is alert and oriented to person, place, and time. Mental status is at baseline.      Coordination: Coordination normal.   Psychiatric:         Mood and Affect: Mood normal.          General skin exam was performed including head, neck, anterior/posterior trunk, bilateral upper & lower extremities and revealed no areas of concern other than those documented.    Assessment and Plan  1. Acrochordon (see #1 on body diagram) - new  - We discussed the diagnosis, etiology, natural course and management of this condition.  - We explained that skin tags usually occur in areas of friction, and can sometimes be irritated by shaving, clothing, jewelry, etc.    - No treatment is necessary, however, we did discuss elective surgical removal if desired.    2. Multiple nevi (see #3 on body diagram and see #1, #2 on head diagram) - new  - Benign in appearance both grossly and on dermsocopy.  - I discussed the ABCDE's of melanoma with the patient and recommended monthly  self-evaluation, photoprotection, and return to clinic with any new or changing moles.  - Will follow.    3. AK (actinic keratosis) (see #3 on head diagram) - new  - 17000 -  DESTRUCTION OF FIRST PREMALIGNANT LESION (AMB ONLY)  - The pre-cancerous nature of these lesions and the relation to chronic sun exposure was discussed with the patient.    - Procedure: After verbal consent. Cryotherapy performed using freeze-thaw-freeze cycle on 1 lesion.  Discussed the expected side effects including pain, edema, vesicles, and pigment changes, especially hypopigmentation.  Patient expressed understanding of expected side effects.  Patient tolerated well.  No complications.  Advised to call with future problems.      4. Seborrheic keratosis (see #2 on body diagram) - new  -I discussed with the patient that a seborrheic keratosis is a common, benign epidermal lesion and that most patients develop at least one seborrheic keratosis in their lifetime.  The condition is usually asymptomatic, and unless disturbed, most seborrheic keratoses persist and grow slowly.  No intervention is indicated at this time.  I will continue to follow the patient.       5. History of Melanoma s/p excision in 1999 (see #4 on body diagram) - stable   -No reoccurrence noted   -Will follow       The patient was educated on the importance of avoiding excessive sun exposure and wearing sunscreen daily SPF 30 or higher.  Advised patient to re-apply sunscreen every 2-3 hours.  Advised the patient to avoid going to and using tanning beds.  Advised to check skin routinely for any changes, especially any new moles or changes in existing moles.     Return in about 6 months (around 08/12/2021).    This is a shared visit with Dr. Milda Smart    Time: I spent 20 minutes with this patient  Jamas Lav, APRN,FNP-BC  02/09/2021, 09:14    TIME:  5 min    I personally saw and evaluated the patient. See mid-level's note for additional details. My findings/participation are  AK - cryo,     Dairl Ponder, MD

## 2021-02-16 ENCOUNTER — Encounter (INDEPENDENT_AMBULATORY_CARE_PROVIDER_SITE_OTHER): Payer: Self-pay | Admitting: Internal Medicine

## 2021-02-16 ENCOUNTER — Ambulatory Visit: Payer: Medicare (Managed Care) | Attending: Internal Medicine | Admitting: Internal Medicine

## 2021-02-16 VITALS — Wt 260.0 lb

## 2021-02-16 DIAGNOSIS — C439 Malignant melanoma of skin, unspecified: Secondary | ICD-10-CM | POA: Insufficient documentation

## 2021-02-16 DIAGNOSIS — M722 Plantar fascial fibromatosis: Secondary | ICD-10-CM | POA: Insufficient documentation

## 2021-02-16 DIAGNOSIS — Z6837 Body mass index (BMI) 37.0-37.9, adult: Secondary | ICD-10-CM | POA: Insufficient documentation

## 2021-02-16 DIAGNOSIS — E669 Obesity, unspecified: Secondary | ICD-10-CM | POA: Insufficient documentation

## 2021-02-16 DIAGNOSIS — I10 Essential (primary) hypertension: Secondary | ICD-10-CM | POA: Insufficient documentation

## 2021-02-16 HISTORY — DX: Essential (primary) hypertension: I10

## 2021-02-16 NOTE — Progress Notes (Signed)
Subjective      Natasha Perez is a 50 y.o. female here to discuss medical weight management and weight-related health conditions.      TELEMEDICINE DOCUMENTATION:    Patient Location:  MyChart video visit from home address: [place] [id number]    Patient/family aware of provider location:  yes  Patient/family consent for telemedicine:  yes  Examination observed and performed by:  Lynnda Shields, MD        Weight at start of medical weight management treatment plan, 02/16/2021: Obesity Class 1. BMI 37.31. weight 260 lbs  Weight today, 02/16/2021: Body mass index is 37.31 kg/m.  Weight: 118 kg (260 lb) (self-reported home weight (telemed))  Highest lifetime weight (pounds): 260 pounds, weigh at start of Medical Weight Management treatment plan  Lowest weight after bariatric surgery, if applicable (pounds): n/a      Weight history     General: average weight as child, was a size 0 in high school, gained 30 pounds in last 2 years, related to work stress  Motivation for wanting to lose weight? Health and not feeling good  Past weight loss diet attempts/successes/meds tried? Self-monitoring, keto      Dietary factors     Dietary and behavior factors: identified on scanned form and summarized in the note assessment below.     Drinking calories in non-alcoholic beverages: Other yes, drinks one mountain dew daily    AUDIT C score for alcohol intake: SCORE: 1, <3 in women or <4 in men considered low risk drinking      Physical activity     Daily activity NOT from formal exercise such as activity from job: Active    Other activity history:    Physical activity vital sign (minutes x days per week): 1x30-45=37.5, walking, yardwork    Resistance or strength exercises: none    Limitations to physical activity: plantar fasciitis      Sleep     PROMIS sleep disturbance score: PROMIS sleep disturbance score: 15, <=24: none to slight    Sleep history elements:   average hours nightly sleep 6 and Less than 7  hours per night    Optional sleep apnea questions--skip if treated for OSA: Epworth score 1          Social history     Household members? 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 Cancer (CMS [place])    . Ectopic pregnancy     2005   . HTN (hypertension) 02/16/2021   . Melanoma (CMS [place]) 1999    right thigh, had 2 or 3 surgeries at [facility], sampled lymph nodes which were ok, still sees dermatologist yearly in [place]   . Plantar fasciitis            Past Surgical History:   Procedure Laterality Date   . HX BREAST BIOPSY Right     needle bx.  age  89    benign   . HX MYOMECTOMY  2005   . HX OTHER      3 right leg surgeries for melanoma   . [place]  Medications Reviewed     diclofenac sodium (VOLTAREN) 75 mg Oral Tablet, Delayed Release (E.C.), Take 1 Tablet (75 mg total) by mouth Twice daily  losartan-hydrochlorothiazide (HYZAAR) 50-12.5 mg Oral Tablet, TAKE ONE TABLET BY MOUTH ONCE DAILY.  multivitamin Tab, take 1 Tab by mouth Once a day. (Patient not taking: No sig reported)  valACYclovir (VALTREX) 500 mg Oral Tablet, TAKE 1 TABLET BY MOUTH THREE TIMES A DAY    No facility-administered medications prior to visit.          Review of Systems     See scanned form.         Behavioral health screenings     Scoff disordered eating: score 0, (score of 0-1 is negative)    GAD7 anxiety: score 4, (score 0-4=minimal symptoms)    PHQ9 depression: score 1, (score 0-4=none)    BEDS 7 binge eating: negative      Objective       Physical exam     Wt 118 kg (260 lb) Comment: self-reported home weight (telemed)  LMP 01/20/2021 (Approximate)   BMI 37.31 kg/m         Body mass index is 37.31 kg/m.    Body weight distribution (android/apple,  gynoid/pear): abdominal predominant (android)  General: No acute distress. and telemed limited exam        Labs     Lab Results   Component Value Date    CHOLESTEROL 174 08/24/2020    HDLCHOL 51 08/24/2020    LDLCHOL 106 (H) 08/24/2020    TRIG 86 08/24/2020        Lab Results   Component Value Date    HA1C 5.1 08/24/2020       Lab Results   Component Value Date    GLUCOSEFAST 86 07/14/2014        Lab Results   Component Value Date    TSH 0.833 08/24/2020        Last BMP  (Last result in the past 2 years)      Na   K   Cl   CO2   BUN   Cr   Calcium   Glucose   Glucose-Fasting        08/24/20 1216 136   4.0   106   23   14   0.99   9.2   89           Last Hepatic Panel  (Last result in the past 2 years)      Albumin   Total PTN   Total Bili   Direct Bili   Ast/SGOT   Alt/SGPT   Alk Phos        08/24/20 1216 4.1   7.7   0.8  Comment: Naproxen therapy can falsely elevate total bilirubin levels.     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      Insurance coverage considerations: [employer]      Has obesity complicated by the following conditions: Hypertension and Joint pain/back pain/osteoarthritis        Summary of multifactorial contributors to excess weight: Irregular eating timing, Eating quickly, Eating out, Less than 5 servings fruits and vegetables  daily, Drinking calories, Poor sleep, Family history        Weight at start of medical weight management treatment plan, 02/16/2021: Obesity Class 1. BMI 37.31. weight 260 lbs  Weight today, 02/16/2021: Body mass index is 37.31 kg/m.  Weight: 118 kg (260 lb) (self-reported home weight (telemed))  Highest lifetime weight (pounds): 260 pounds, weigh at start of Medical Weight Management treatment plan  Lowest weight after bariatric surgery, if  applicable (pounds): n/a        Weight loss outcome goals:    Self-determined long-term goal: achieve a weight of 180 lbs   Realistic first goal (10% weight loss in 6 months): lose 25 lbs   Short-term goal:1-2 lbs/week       Plan     Food     Goal is satisfying individualized eating pattern that can be maintained for life.   Healthy eating patterns include vegetables, protein, and healthy fats from high quality whole foods.   Drinking mostly water is best because drinking calories is high risk for weight gain.   Focus on incorporating positive nutritional foods to naturally minimize intake of sugar and processed carbs.     Nutrition prescription: per Registered Dietitian with patient input to identify preferences: Flexible healthy eating        Movement     Physical activity is important for health, but weight loss comes more from food changes whereas activity helps with weight maintenance. (Loss requires very high amounts activity.)    Walking does not require special equipment or money, and can be tracked with a pedometer or phone app.     Increase activity gradually as tolerated: it does not need to be done at one time and does not need to be from formal exercise.    Physical activity choice (enjoyable preferred): walking once plantar fasciitis better     Behavior: 4 S's of behavior change for weight management       Self-monitoring associated with success so for tracking food, movement, sleep, and weight, choice: paper.   For best results, should bring logs to visits for review.      Support, such as from behavior change program(s) (optional, note if any): None of the above.   Regular visits also provide support even during setbacks.   Medical Weight Management Facebook support group can provide additional support: [email or web address]      Stimulus control: A healthy food environment is important for setting oneself up for success.        Setting goals:  Behavior change  self-determined goals:      Goals     . Medical weight management      Continue tracking food intake  Continue working on improving sleep quantity and quality  Plan meals and eat more regularly  Try not to eat after 8pm    Look into insurance coverage for medications.  Consider quitting mountain dew.              Medical     Check labs? None: recently done, available in EMR     Psychology referral (such as for behavioral health screening scores, night eating, disordered eating): None    Genetic testing for rare genetic causes of obesity? does not qualify    Health promotion and prevention:   COVID-19 vaccinated? yes    Sleep: Good sleep, at least 7-8 hours per day, is important for optimizing health and can help with weight loss.     Refer to sleep clinic and/or  check sleep study? No      Surgical/procedural treatment preferences? Would NOT consider bariatric surgery      Medications     Medication treatment preferences?  Would consider     Conception plans for females of childbearing age? Conception plans for females of childbearing age?: Contraception method if applicablenot trying to conceive but still has periods regularly at age 17    Meds associated with weight gain: none      Past meds tried that are favorable for weight management: none    Related disorders (DM, Hypothyroid, headaches, depression) or contraindications to meds that may affect medication decisions:   none      Medication change and treatment plan summary (AOMs, deprescribing, med optimization):    Discussed that since she likely does not have GLP-1 agonist coverage, her best options would be either contrave or topiramate. 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     She will message me if she wants to try topamax or contrave (either brand name or as 2 generic meds) (or if  she finds out she has coverage for [facility] or saxenda).           No follow-ups on file.  Follow up 1-2 weeks with Registered Dietitian and approx 1 month with MD or [place]. Virtual appointments unless she knows she will be in town.       No orders of the defined types were placed in this encounter.        Lynnda Shields, MD  02/16/2021, 09:01  [facility] Medical Weight Management: [email or web address]  Facebook support group: [email or web address]             Total time spent on date of service was 55 minutes, which includes some or all of the following: reviewing records, obtaining the history, counseling, goal-setting, placing orders, coordinating care, and documenting.

## 2021-02-17 ENCOUNTER — Encounter (INDEPENDENT_AMBULATORY_CARE_PROVIDER_SITE_OTHER): Payer: Self-pay | Admitting: Internal Medicine

## 2021-02-17 ENCOUNTER — Encounter (HOSPITAL_BASED_OUTPATIENT_CLINIC_OR_DEPARTMENT_OTHER): Payer: Self-pay | Admitting: Family

## 2021-02-24 ENCOUNTER — Ambulatory Visit (INDEPENDENT_AMBULATORY_CARE_PROVIDER_SITE_OTHER): Payer: Self-pay | Admitting: Nutritionist

## 2021-02-24 ENCOUNTER — Encounter (INDEPENDENT_AMBULATORY_CARE_PROVIDER_SITE_OTHER): Payer: Self-pay

## 2021-02-24 NOTE — Telephone Encounter (Signed)
-----   Message from Marella Chimes sent at 02/24/2021  4:17 PM EDT -----  Natasha Perez  The pt is wanting to know if it is possible for her to do a video appt for the appt tomorrow instead of inperson. Is this possible? Please advise.  Thanks

## 2021-02-25 ENCOUNTER — Ambulatory Visit (HOSPITAL_BASED_OUTPATIENT_CLINIC_OR_DEPARTMENT_OTHER): Payer: Medicare (Managed Care) | Admitting: DERMATOLOGY

## 2021-02-25 ENCOUNTER — Ambulatory Visit: Payer: Medicare (Managed Care) | Attending: Emergency Medicine | Admitting: Emergency Medicine

## 2021-02-25 ENCOUNTER — Ambulatory Visit (HOSPITAL_BASED_OUTPATIENT_CLINIC_OR_DEPARTMENT_OTHER)
Admission: RE | Admit: 2021-02-25 | Discharge: 2021-02-25 | Disposition: A | Discharge status: Home / Self Care | Payer: Medicare (Managed Care) | Source: Ambulatory Visit | Admitting: Radiology

## 2021-02-25 ENCOUNTER — Encounter (INDEPENDENT_AMBULATORY_CARE_PROVIDER_SITE_OTHER): Payer: Medicare (Managed Care) | Admitting: Nutritionist

## 2021-02-25 ENCOUNTER — Ambulatory Visit: Payer: Medicare (Managed Care) | Admitting: Nutritionist

## 2021-02-25 ENCOUNTER — Encounter (HOSPITAL_BASED_OUTPATIENT_CLINIC_OR_DEPARTMENT_OTHER): Payer: Self-pay | Admitting: Emergency Medicine

## 2021-02-25 ENCOUNTER — Other Ambulatory Visit: Payer: Self-pay

## 2021-02-25 ENCOUNTER — Encounter (INDEPENDENT_AMBULATORY_CARE_PROVIDER_SITE_OTHER): Payer: Self-pay | Admitting: Nutritionist

## 2021-02-25 DIAGNOSIS — L988 Other specified disorders of the skin and subcutaneous tissue: Secondary | ICD-10-CM | POA: Insufficient documentation

## 2021-02-25 DIAGNOSIS — M7918 Myalgia, other site: Secondary | ICD-10-CM

## 2021-02-25 DIAGNOSIS — M25521 Pain in right elbow: Secondary | ICD-10-CM | POA: Insufficient documentation

## 2021-02-25 DIAGNOSIS — M79671 Pain in right foot: Secondary | ICD-10-CM

## 2021-02-25 DIAGNOSIS — M25571 Pain in right ankle and joints of right foot: Secondary | ICD-10-CM

## 2021-02-25 DIAGNOSIS — S86211A Strain of muscle(s) and tendon(s) of anterior muscle group at lower leg level, right leg, initial encounter: Secondary | ICD-10-CM | POA: Insufficient documentation

## 2021-02-25 DIAGNOSIS — Z713 Dietary counseling and surveillance: Secondary | ICD-10-CM | POA: Insufficient documentation

## 2021-02-25 DIAGNOSIS — S93491A Sprain of other ligament of right ankle, initial encounter: Secondary | ICD-10-CM | POA: Insufficient documentation

## 2021-02-25 DIAGNOSIS — M7671 Peroneal tendinitis, right leg: Secondary | ICD-10-CM | POA: Insufficient documentation

## 2021-02-25 DIAGNOSIS — W19XXXA Unspecified fall, initial encounter: Secondary | ICD-10-CM

## 2021-02-25 DIAGNOSIS — Z411 Encounter for cosmetic surgery: Secondary | ICD-10-CM

## 2021-02-25 DIAGNOSIS — M791 Myalgia, unspecified site: Secondary | ICD-10-CM | POA: Insufficient documentation

## 2021-02-25 DIAGNOSIS — S86811A Strain of other muscle(s) and tendon(s) at lower leg level, right leg, initial encounter: Secondary | ICD-10-CM

## 2021-02-25 DIAGNOSIS — Z029 Encounter for administrative examinations, unspecified: Secondary | ICD-10-CM

## 2021-02-25 NOTE — Progress Notes (Signed)
DERMATOLOGY,  [facility]  [place] [address]  Operated by [place]     Name: [name] MRN:  [id number]   Date: 02/25/2021 Age: 50 y.o.     S: Natasha Perez is a 50 y.o. female presents for cosmetic procedure.     O: dynamic rhytides appreciated in glabella and forehead     A/P:   #Encounter for cosmetic procedure    - Written consent obtained. Area cleaned with alcohol swab. 15 units of Botox injected into glabella. 8 units of Botox injected into forehead. Patient charged $230 for cosmetic procedure. THIS IS A COSMETIC VISIT.  NO ADDITIONAL CHARGE TO BE GENERATED FOR THIS VISIT.        Corliss Skains, MD  Dermatology Resident, PGY-2     See resident's note for details.  I saw and examined the patient and agree with the resident's findings and plan as written except as noted, and I was present and supervised/observed the critical portion of all procedures.     Ricki Miller, MD  02/28/2021, 12:53

## 2021-02-25 NOTE — Progress Notes (Signed)
Name: Natasha Perez  MEDICAL RECORD [id number]  DATE OF BIRTH: December 08, 1970  DATE OF SERVICE: 02/25/2021    Referring Provider: Tamela Oddi, DO      CC:   Chief Complaint   Patient presents with   . Elbow Pain          HPI:  Natasha Perez is a 50 y.o. female who presents to clinic today with complaint of fall, multiple areas of pain.  States that 3 weeks ago, fell off the roof of her camper.  Landed on her right ankle, rolled it, and hit her tailbone and right elbow.  Went to urgent care, was placed in a CAM boot, told to follow up with sports.  Reports improvement in pain in all 3 areas over the past several weeks.  Has been wearing CAM boot, able to ambulate without difficulty without CAM boot.  Has been able to work with CAM boot.  Tailbone still feels sore, but feels much better.  Reports pain if she 'clenches her butthole.'  Some pain if she presses on her right elbow, but denies any issues with ROM or strength.  Denies numbness, tingling, weakness.        PAST MEDICAL HISTORY  Past Medical History:   Diagnosis Date   . Cancer (CMS [place])    . Ectopic pregnancy     2005   . HTN (hypertension) 02/16/2021   . Melanoma (CMS [place]) 1999    right thigh, had 2 or 3 surgeries at [facility], sampled lymph nodes which were ok, still sees dermatologist yearly in [place]   . Plantar fasciitis            PAST SURGICAL HISTORY:  Past Surgical History:   Procedure Laterality Date   . HX BREAST BIOPSY Right     needle bx.  age  22    benign   . HX MYOMECTOMY  2005   . HX OTHER      3 right leg surgeries for melanoma   . HX REFRACTIVE SURGERY  2000           SOCIAL HISTORY:  Social History     Socioeconomic History   . Marital status: Single     Spouse name: Not on file   . Number of children: 0   . Years of education: 18+   . Highest education level: Not on file   Occupational History   . Occupation: [employer]   Tobacco Use   . Smoking status: Never Smoker   . Smokeless tobacco: Never Used    Vaping Use   . Vaping Use: Never used   Substance and Sexual Activity   . Alcohol use: No     Alcohol/week: 0.0 standard drinks     Comment: rare   . Drug use: No   . Sexual activity: Yes     Partners: Male     Birth control/protection: None     Comment: Partner for 12 years   Other Topics Concern   . Abuse/Domestic Violence No   . Breast Self Exam Yes   . Caffeine Concern No   . Calcium intake adequate Yes   . Computer Use Not Asked   . Drives Not Asked   . Exercise Concern No     Comment: Tries to walk or bike.  Not getting as much exercise as she thinks she should.   Marland Kitchen Helmet Use Not Asked   . Seat  Belt Not Asked   . Special Diet No     Comment: Only eating one meal a day now. Dinner is hit or miss.  Needs to eat more regularly she says.    . Sunscreen used Not Asked   . Uses Cane Not Asked   . Uses walker Not Asked   . Uses wheelchair Not Asked   . Right hand dominant Not Asked   . Left hand dominant Not Asked   . Ambidextrous Not Asked   . Shift Work Not Asked   . Unusual Sleep-Wake Schedule Not Asked   . Ability to Walk 1 Flight of Steps without SOB/CP Yes   . Routine Exercise No   . Ability to Walk 2 Flight of Steps without SOB/CP Yes   . Unable to Ambulate No   . Total Care No   . Ability To Do Own ADL's Yes   . Uses Walker No   . Other Activity Level Yes   . Uses Cane No   Social History Narrative    Lives in [place].  No children. 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  Was an only child.  Got undergraduate degree at [employer] and a math degree at [employer].      Social Determinants of Health     Financial Resource Strain: Not on file   Food Insecurity: Not on file   Transportation Needs: Not on file   Physical Activity: Not on file   Stress: Not on file   Intimate Partner Violence: Not on file   Housing Stability: Not on file       FAMILY HISTORY:  Family Medical History:     Problem Relation (Age of Onset)    Breast Cancer Maternal Aunt     Cancer Other    Diabetes Mother    Hypertension (High Blood Pressure) Mother    Lung Cancer Maternal Grandmother    Stroke Mother (52)            MEDICATIONS  Current Outpatient Medications   Medication Sig   . diclofenac sodium (VOLTAREN) 75 mg Oral Tablet, Delayed Release (E.C.) Take 1 Tablet (75 mg total) by mouth Twice daily   . losartan-hydrochlorothiazide (HYZAAR) 50-12.5 mg Oral Tablet TAKE ONE TABLET BY MOUTH ONCE DAILY.   . multivitamin Tab Take 1 Tablet by mouth Once a day   . valACYclovir (VALTREX) 500 mg Oral Tablet TAKE 1 TABLET BY MOUTH THREE TIMES A DAY       ALLERGIES  Allergies   Allergen Reactions   . Iodine And Iodide Containing Products Itching, Swelling and Hives/ Urticaria   . Sulfa (Sulfonamides) Nausea/ Vomiting       REVIEW OF SYSTEMS  Constitutional: No fevers.  No chills.  Cardiovascular: No chest pain.  No palpitations.  Pulmonary: No shortness of breath.  No cough.  Gastrointestinal: Noabdominal pain.  No nausea.  No vomiting.  Neurologic: No headache.  No paresthesias.  As above, otherwise all other systems are essentially negative.    PHYSICAL EXAM  BP 117/80   Pulse (!) 104   Temp 36.3 C (97.3 F)   Ht 1.79 m (5' 10.47")   Wt 116 kg (255 lb 1.2 oz)   BMI 36.11 kg/m         General: No acute distress.  Appears comfortable.  Head: Normocephalic and atraumatic.  Circulation: Distal pulses intact.  Cap refill < 3 sec.  Respiratory:  Breathing unlabored.    Skin: Warm and dry.  No rash.  Neurologic: DSILT.    Musculoskeletal:  Right Ankle:   The patient has no visible erythema or swelling. ROM: Dorsiflexion: 20, Plantarflexion: 45, Inversion: 20, Eversion: 10. There is some TTP over ATFL. No pain over the CFL, Deltoid ligament, PTFL or syndesmosis. Some pain with palpation over the peroneal tendons. No tenderness over the base of the 5th or navicular. No tenderness over the lateral or medial malleolus. Has 5/5 strength to plantarflexion, dorsiflexion, inversion and eversion. Anterior  drawer negative and without significant laxity. Negative talar tilt. Negative reverse talar tilt. No TTP with palpation of the ankle mortise. No TTP over posterior ankle. Squeeze test negative. Thompson testing negative. Neurovascularly intact.    Right Elbow:  The patient has tenderness to palpation over radial head. She does not have decreased range of motion. 5/5 strength in flexion & extension. Valgus & varus stress to elbow without laxity or pain. Milking maneuver performed & negative. Resisted wrist flexion without pain.     IMAGING STUDIES:   Reviewed calcaneal x-ray from today in clinic showing no acute fractures/dislocations.    ASSESSMENT:    1. Fall, initial encounter    2. Buttock pain    3. Right elbow pain    4. Acute right ankle pain    5. Right foot pain    6. Peroneal tendinitis of right lower extremity    7. Sprain of anterior talofibular ligament of right ankle, initial encounter    8. Strain of right tibialis anterior muscle, initial encounter      PLAN:    -At today's visit, we discussed treatment options concerning patient's multiple injuries.  -Ankle sprain improving, will transition to ASO brace  -ASO for next few weeks, then on uneven surfaces for next few months  -Plantar fasciitis, discussed using night splints, icing, and working on stretching/strengthening via ATC Tera    -Elbow with some tenderness, but reassuring exam, continue gentle ROM  -Continue current medication, RICE as needed  -RTC as needed.   - If the patient has any questions or concerns prior to next visit, she can contact our office.    Dyke Maes, MD 02/25/2021, 13:40    I saw and examined the patient.  I reviewed the fellow's note.  I agree with the findings and plan of care as documented in the fellow's note.  Any exceptions/additions are edited/noted.    On the day of the encounter, a total of  31 minutes was spent on this patient encounter including review of historical information, examination, documentation and  post-visit activities.     Inda Castle, MD  02/25/2021, 15:01

## 2021-02-25 NOTE — Progress Notes (Signed)
Natasha Perez  [id number]  02/25/2021    Involved area: bilateral, right Foot    Therapeutic Exercise:  Per Dr. Leotis Shames, patient was provided with home exercise program for plantar fasciitis. Program focuses on myofascial release, ROM, strengthening, and stretching the plantar fascia/surrounding structures. Exercises given include gastroc/soleus stretching, flexor hallicus longus stretching, ball/frozen water bottle myofascial release, towel crunches, and small object toe pickups.    Exercises were explained and demonstrated to patient.  Patient verified that she understood therapeutic exercises, including sets/reps/frequency and goals.  Written instructions were provided in the form of the Plantar Fasciitis Home Exercise Program.     Questions were encouraged and all questions were answered.     Goals for therapeutic exercise:  -Decrease pain  -Increase ease of ADL  -Increase flexibility     Tera Fetty, MS ATC, LAT  Certified Athletic Trainer

## 2021-02-25 NOTE — Progress Notes (Signed)
TELEMEDICINE DOCUMENTATION:    Patient Location:  MyChart video visit from home address: [place] [address]    Patient/family aware of provider location:  yes  Patient/family consent for telemedicine:  yes  Examination observed and performed by:  Jacquiline Doe, [facility]   Operated by [facility]  New Patient Nutrition Assessment  Patient Name:Natasha Perez   MRN: [id number]     DOB:04-15-1971     Age: 50 y.o.      Assessment  Subjective:  Patient is being seen at this time for medical nutrition therapy counseling for weight management and related conditions. Has established as a new patient in our [facility] clinic on 02/16/2021 with Dr. Donny Pique Keto, and IF    Wt and diet DJ:SHFWYOV weight as child, was a size 0 in high school, gained 30 pounds in last 2 years, related to work stress  Social ZC:HYIF alone  Works at [employer]: no  Sleep issues: sometimes not a lot of turn around with work  Stress level: low-    Nutrition/food Behaviors: works at hospital.     Typical eating pattern:   __~1__ meals per day _~2___ snacks per day  B:  L:eats around 4:30  D: cereal, yogurt, soup    Snacks:  Beverages:  Mt Dew a day    Adequate intake of fruits and vegetables (5 a day+) []   Chooses whole grains  []   Adequate intake of lean proteins to meet estimated needs (plant or animal based)[x]   Adequate intake of dairy or equivalent (2-3 servings per day)[x]   Choosing heart healthy fats often (mono/polyunsaturated) []   Intake of sugar sweetened beverages [x]     Intake of alcohol []  Never []  Rare  []  Moderation  []  Excess    Meal planning/preparation: she does  Eating consistent meals/snacks: no  Often will graze at work as that is all she has time to do  Portions: small to average  Eating until over full: no  Eating too fast: not really  Reading nutrition facts labels: yes  Dining out/take out: yes  Unintentional eating  (mindless/boredom eating):   Emotional/stress eating:   Trigger foods:   Cravings/addictive food behaviors:       Physical Activity: has some ortho issues right now      Barriers to healthy eating/weight loss: fast pace , demanding work      Objective  Medical History:   Past Medical History:   Diagnosis Date   . Cancer (CMS [place])    . Ectopic pregnancy     2005   . HTN (hypertension) 02/16/2021   . Melanoma (CMS [place]) 1999    right thigh, had 2 or 3 surgeries at [facility], sampled lymph nodes which were ok, still sees dermatologist yearly in [place]   . Plantar fasciitis            Surgical History:  Past Surgical History:   Procedure Laterality Date   . COLONOSCOPY N/A 04/26/2016    Performed by Sharyn Creamer., MD at [place]   . HX BREAST BIOPSY Right     needle bx.  age  62    benign   . HX MYOMECTOMY  2005   . HX OTHER      3 right leg surgeries for melanoma   . HX REFRACTIVE SURGERY  2000       Current medications:   Current Outpatient  Medications   Medication Sig   . diclofenac sodium (VOLTAREN) 75 mg Oral Tablet, Delayed Release (E.C.) Take 1 Tablet (75 mg total) by mouth Twice daily   . losartan-hydrochlorothiazide (HYZAAR) 50-12.5 mg Oral Tablet TAKE ONE TABLET BY MOUTH ONCE DAILY.   . multivitamin Tab take 1 Tab by mouth Once a day. (Patient not taking: No sig reported)   . valACYclovir (VALTREX) 500 mg Oral Tablet TAKE 1 TABLET BY MOUTH THREE TIMES A DAY       Meds relevant to weight management:     Current laboratory values reviewed:    Lab Results   Component Value Date/Time    SODIUM 136 08/24/2020 12:16 PM    POTASSIUM 4.0 08/24/2020 12:16 PM    CHLORIDE 106 08/24/2020 12:16 PM    CO2 23 08/24/2020 12:16 PM    ANIONGAP 7 08/24/2020 12:16 PM    BUN 14 08/24/2020 12:16 PM    CREATININE 0.99 08/24/2020 12:16 PM     Lab Results   Component Value Date/Time    CALCIUM 9.2 08/24/2020 12:16 PM    TOTALPROTEIN 7.7 08/24/2020 12:16 PM    AST 25 08/24/2020 12:16 PM    ALT 21 08/24/2020 12:16 PM     No results  found for: IRON  No results found for: B12  Lab Results   Component Value Date    TRIG 86 08/24/2020    HDLCHOL 51 08/24/2020    LDLCHOL 106 (H) 08/24/2020    CHOLESTEROL 174 08/24/2020     Lab Results   Component Value Date    HA1C 5.1 08/24/2020       FOOD allergies/food intolerances: none    Supplement intake (vit/min/herbals):     Today's Vitals  There were no vitals taken for this visit.      Wt Readings from Last 6 Encounters:   02/16/21 118 kg (260 lb)   02/07/21 119 kg (262 lb)   01/06/21 117 kg (257 lb 0.9 oz)   08/24/20 115 kg (254 lb 6.6 oz)   10/10/19 111 kg (244 lb 4.3 oz)   07/17/19 108 kg (238 lb 1.6 oz)       Estimate energy requirement per Mifflin-St. Jeor  Formula:  Female REE: 1863 kcals    Multiply REE X Activity Factor (1.4) = 2608 kcal/day          Minus  750  - 1000 2841-3244 kcal/day to promote weight loss: ~1-2#/week    Protein needs: 86 (1g/Kg Adj bw)    Diagnosis    PES statement  Obesity class 2 related to irregular eating/ stress at work as evidenced by patient interview and BMI 36.11    Interventions    Nutrition Prescription:  regular meal pattern following the plate method    Nutrition Ed: Discussed nutritional needs for weight management, encouraging nutrient rich food choices, using the healthy eating plate as a guideline, reading food labels to to help make healthy food choices, and healthy meal planning and preparation to fit personal lifestyle.  Possible meal/snack changes: plan meals and snakc  Behavioral strategies: Discussed tracking intake for self monitoring, portion awareness, and stimulus control (keeping a healthy food environment).      Monitoring and Evaluation    Lifestyle goals identified during initial assessment to be monitored:   1.Try to establish an eating schedule  2. Limit the amount of carbs in your diet to ~ 30 grams a meal  3. Set weekly SMART goals  4. Increase whatever vegetable  you will eat  F/U: Pt understands that they can contact me via My Chart at any  time for questions, concerns, or to schedule follow up as needed.    No follow-ups on file.    Time spent counseling patient: 30 mins    Jacquiline Doe, [place] 02/25/2021, 07:34

## 2021-02-26 ENCOUNTER — Other Ambulatory Visit: Payer: Self-pay

## 2021-02-26 DIAGNOSIS — I10 Essential (primary) hypertension: Secondary | ICD-10-CM

## 2021-02-28 ENCOUNTER — Other Ambulatory Visit: Payer: Self-pay

## 2021-03-01 ENCOUNTER — Other Ambulatory Visit: Payer: Self-pay

## 2021-03-01 DIAGNOSIS — M21611 Bunion of right foot: Secondary | ICD-10-CM

## 2021-03-01 DIAGNOSIS — M2011 Hallux valgus (acquired), right foot: Secondary | ICD-10-CM

## 2021-03-02 ENCOUNTER — Other Ambulatory Visit (HOSPITAL_BASED_OUTPATIENT_CLINIC_OR_DEPARTMENT_OTHER): Payer: Self-pay

## 2021-03-02 DIAGNOSIS — I10 Essential (primary) hypertension: Secondary | ICD-10-CM

## 2021-03-05 MED ORDER — TOPIRAMATE 25 MG TABLET
50.0000 mg | ORAL_TABLET | Freq: Every evening | ORAL | 3 refills | Status: DC
Start: 2021-03-05 — End: 2021-10-31

## 2021-03-05 NOTE — Telephone Encounter (Signed)
-----   Message from Vivia Budge, RN sent at 03/02/2021  2:30 PM EDT -----  Regarding: FW: Visit F/U question    ----- Message -----  From: Clancy Gourd  Sent: 03/02/2021   2:22 PM EDT  To: Medicine Weight Management Poc  Subject: Visit F/U question                               Can u send a prescription to my pharmacy in [place] for as many refills as possible.  I'm leaving for [place] next week.: [facility]  [address]

## 2021-03-07 ENCOUNTER — Telehealth (INDEPENDENT_AMBULATORY_CARE_PROVIDER_SITE_OTHER): Payer: Self-pay | Admitting: Internal Medicine

## 2021-03-07 DIAGNOSIS — Z79899 Other long term (current) drug therapy: Secondary | ICD-10-CM

## 2021-03-07 NOTE — Telephone Encounter (Signed)
-----   Message from Adolph Pollack, RN sent at 03/07/2021  9:24 AM EDT -----  Regarding: FW: Visit F/U question    ----- Message -----  From: Natasha Perez  Sent: 03/06/2021   7:05 PM EDT  To: Medicine Weight Management Poc  Subject: Visit F/U question                               Picked up med thank you!      I was reading insert and said not to take with HCTZ?  I take losarten / HCTZ for BP?  Is this ok to take with my BP med?  Please clarify?

## 2021-03-12 ENCOUNTER — Encounter (HOSPITAL_BASED_OUTPATIENT_CLINIC_OR_DEPARTMENT_OTHER): Payer: Self-pay | Admitting: Obstetrics & Gynecology

## 2021-04-06 ENCOUNTER — Ambulatory Visit (INDEPENDENT_AMBULATORY_CARE_PROVIDER_SITE_OTHER): Payer: Medicare (Managed Care)

## 2021-04-11 ENCOUNTER — Other Ambulatory Visit: Payer: Self-pay

## 2021-04-15 ENCOUNTER — Encounter (HOSPITAL_BASED_OUTPATIENT_CLINIC_OR_DEPARTMENT_OTHER): Payer: Self-pay

## 2021-05-03 ENCOUNTER — Ambulatory Visit (INDEPENDENT_AMBULATORY_CARE_PROVIDER_SITE_OTHER): Payer: Medicare (Managed Care) | Admitting: Nutritionist

## 2021-05-05 ENCOUNTER — Encounter (HOSPITAL_BASED_OUTPATIENT_CLINIC_OR_DEPARTMENT_OTHER): Payer: Self-pay | Admitting: Family

## 2021-05-26 ENCOUNTER — Encounter (INDEPENDENT_AMBULATORY_CARE_PROVIDER_SITE_OTHER): Payer: Self-pay | Admitting: Internal Medicine

## 2021-07-05 ENCOUNTER — Ambulatory Visit (INDEPENDENT_AMBULATORY_CARE_PROVIDER_SITE_OTHER): Payer: Self-pay | Admitting: NURSE PRACTITIONER, FAMILY

## 2021-07-13 ENCOUNTER — Encounter (FREE_STANDING_LABORATORY_FACILITY): Admit: 2021-07-13 | Discharge: 2021-07-13 | Disposition: A | Discharge status: Home / Self Care | Payer: Self-pay

## 2021-07-13 ENCOUNTER — Other Ambulatory Visit: Payer: Self-pay

## 2021-07-13 ENCOUNTER — Ambulatory Visit: Payer: Medicare (Managed Care) | Attending: Family Medicine | Admitting: Family Medicine

## 2021-07-13 ENCOUNTER — Other Ambulatory Visit (FREE_STANDING_LABORATORY_FACILITY): Payer: Self-pay | Admitting: Anatomic Pathology & Clinical Pathology

## 2021-07-13 ENCOUNTER — Ambulatory Visit: Payer: Self-pay | Attending: DERMATOLOGY | Admitting: DERMATOLOGY

## 2021-07-13 ENCOUNTER — Encounter (INDEPENDENT_AMBULATORY_CARE_PROVIDER_SITE_OTHER): Payer: Self-pay | Admitting: Family Medicine

## 2021-07-13 VITALS — BP 130/80 | HR 71 | Temp 97.5°F | Ht 70.5 in | Wt 250.0 lb

## 2021-07-13 DIAGNOSIS — Z411 Encounter for cosmetic surgery: Secondary | ICD-10-CM

## 2021-07-13 DIAGNOSIS — Z6835 Body mass index (BMI) 35.0-35.9, adult: Secondary | ICD-10-CM | POA: Insufficient documentation

## 2021-07-13 DIAGNOSIS — Z79899 Other long term (current) drug therapy: Secondary | ICD-10-CM | POA: Insufficient documentation

## 2021-07-13 DIAGNOSIS — L988 Other specified disorders of the skin and subcutaneous tissue: Secondary | ICD-10-CM | POA: Insufficient documentation

## 2021-07-13 DIAGNOSIS — I10 Essential (primary) hypertension: Secondary | ICD-10-CM | POA: Insufficient documentation

## 2021-07-13 DIAGNOSIS — E559 Vitamin D deficiency, unspecified: Secondary | ICD-10-CM | POA: Insufficient documentation

## 2021-07-13 DIAGNOSIS — H6121 Impacted cerumen, right ear: Secondary | ICD-10-CM | POA: Insufficient documentation

## 2021-07-13 DIAGNOSIS — Z23 Encounter for immunization: Secondary | ICD-10-CM | POA: Insufficient documentation

## 2021-07-13 DIAGNOSIS — Z021 Encounter for pre-employment examination: Secondary | ICD-10-CM | POA: Insufficient documentation

## 2021-07-13 LAB — CBC WITH DIFF
BASOPHIL #: <0.1 10*3/uL (ref <=0.20)
BASOPHIL %: 1 %
EOSINOPHIL #: <0.1 10*3/uL (ref <=0.50)
EOSINOPHIL %: 1 %
HCT: 44.2 % (ref 34.8–46.0)
HGB: 13.9 g/dL (ref 11.5–16.0)
IMMATURE GRANULOCYTE #: <0.1 10*3/uL (ref <0.10)
IMMATURE GRANULOCYTE %: 0 % (ref 0–1)
LYMPHOCYTE #: 1.79 10*3/uL (ref 1.00–4.80)
LYMPHOCYTE %: 25 %
MCH: 26.9 pg (ref 26.0–32.0)
MCHC: 31.4 g/dL (ref 31.0–35.5)
MCV: 85.5 fL (ref 78.0–100.0)
MONOCYTE #: 0.46 10*3/uL (ref 0.20–1.10)
MONOCYTE %: 7 %
MPV: 8.2 fL — ABNORMAL LOW (ref 8.7–12.5)
NEUTROPHIL #: 4.66 10*3/uL (ref 1.50–7.70)
NEUTROPHIL %: 66 %
PLATELETS: 446 10*3/uL — ABNORMAL HIGH (ref 150–400)
RBC: 5.17 10*6/uL (ref 3.85–5.22)
RDW-CV: 13.1 % (ref 11.5–15.5)
WBC: 7.1 10*3/uL (ref 3.7–11.0)

## 2021-07-13 LAB — URINALYSIS, MACROSCOPIC AND MICROSCOPIC - CLIENT CONSOLIDATED
BILIRUBIN: NEGATIVE mg/dL
BLOOD: NEGATIVE mg/dL
COLOR: NORMAL
GLUCOSE: NEGATIVE mg/dL
KETONES: NEGATIVE mg/dL
LEUKOCYTES: NEGATIVE WBCs/uL
NITRITE: NEGATIVE
PH: 5 (ref 5.0–8.0)
PROTEIN: NEGATIVE mg/dL
RBCS: 0 /hpf (ref <6.0)
SPECIFIC GRAVITY: 1.011 (ref 1.005–1.030)
UROBILINOGEN: NEGATIVE mg/dL
WBCS: <1 /hpf (ref <11.0)

## 2021-07-13 LAB — URINALYSIS, MACROSCOPIC
BILIRUBIN: NEGATIVE mg/dL
BLOOD: NEGATIVE mg/dL
COLOR: NORMAL
GLUCOSE: NEGATIVE mg/dL
KETONES: NEGATIVE mg/dL
LEUKOCYTES: NEGATIVE WBCs/uL
NITRITE: NEGATIVE
PH: 5 (ref 5.0–8.0)
PROTEIN: NEGATIVE mg/dL
SPECIFIC GRAVITY: 1.011 (ref 1.005–1.030)
UROBILINOGEN: NEGATIVE mg/dL

## 2021-07-13 LAB — URINALYSIS, MICROSCOPIC
RBCS: 0 /hpf (ref <6.0)
WBCS: <1 /hpf (ref <11.0)

## 2021-07-13 LAB — CBC/DIFF - CLIENT CONSOLIDATED
BASOPHIL #: <0.1 10*3/uL (ref <=0.20)
BASOPHIL %: 1 %
EOSINOPHIL #: <0.1 10*3/uL (ref <=0.50)
EOSINOPHIL %: 1 %
HCT: 44.2 % (ref 34.8–46.0)
HGB: 13.9 g/dL (ref 11.5–16.0)
IMMATURE GRANULOCYTE #: <0.1 10*3/uL (ref <0.10)
IMMATURE GRANULOCYTE %: 0 % (ref 0–1)
LYMPHOCYTE #: 1.79 10*3/uL (ref 1.00–4.80)
LYMPHOCYTE %: 25 %
MCH: 26.9 pg (ref 26.0–32.0)
MCHC: 31.4 g/dL (ref 31.0–35.5)
MCV: 85.5 fL (ref 78.0–100.0)
MONOCYTE #: 0.46 10*3/uL (ref 0.20–1.10)
MONOCYTE %: 7 %
MPV: 8.2 fL — ABNORMAL LOW (ref 8.7–12.5)
NEUTROPHIL #: 4.66 10*3/uL (ref 1.50–7.70)
NEUTROPHIL %: 66 %
PLATELETS: 446 10*3/uL — ABNORMAL HIGH (ref 150–400)
RBC: 5.17 10*6/uL (ref 3.85–5.22)
RDW-CV: 13.1 % (ref 11.5–15.5)
WBC: 7.1 10*3/uL (ref 3.7–11.0)

## 2021-07-13 LAB — COMPREHENSIVE METABOLIC PNL, FASTING
ALBUMIN: 4.2 g/dL (ref 3.5–5.0)
ALKALINE PHOSPHATASE: 106 U/L (ref 50–130)
ALT (SGPT): 18 U/L (ref 8–22)
ANION GAP: 7 mmol/L (ref 4–13)
AST (SGOT): 24 U/L (ref 8–45)
BILIRUBIN TOTAL: 0.6 mg/dL (ref 0.3–1.3)
BUN/CREA RATIO: 8 (ref 6–22)
BUN: 7 mg/dL — ABNORMAL LOW (ref 8–25)
CALCIUM: 9.3 mg/dL (ref 8.5–10.0)
CHLORIDE: 104 mmol/L (ref 96–111)
CO2 TOTAL: 26 mmol/L (ref 22–30)
CREATININE: 0.86 mg/dL (ref 0.60–1.05)
ESTIMATED GFR: 81 mL/min/BSA (ref >=60)
GLUCOSE: 93 mg/dL (ref 70–99)
POTASSIUM: 3.5 mmol/L (ref 3.5–5.1)
PROTEIN TOTAL: 8 g/dL (ref 6.4–8.3)
SODIUM: 137 mmol/L (ref 136–145)

## 2021-07-13 LAB — LIPID PANEL
CHOL/HDL RATIO: 3.5
CHOLESTEROL: 183 mg/dL (ref 100–200)
HDL CHOL: 53 mg/dL (ref >=50)
LDL CALC: 104 mg/dL — ABNORMAL HIGH (ref <100)
NON-HDL: 130 mg/dL (ref <=190)
TRIGLYCERIDES: 131 mg/dL (ref <150)
VLDL CALC: 26 mg/dL (ref <30)

## 2021-07-13 MED ORDER — TRIAMCINOLONE ACETONIDE 0.1 % TOPICAL CREAM
TOPICAL_CREAM | Freq: Two times a day (BID) | CUTANEOUS | 1 refills | Status: DC | PRN
Start: 2021-07-13 — End: 2021-11-01

## 2021-07-13 MED ORDER — LOSARTAN 50 MG-HYDROCHLOROTHIAZIDE 12.5 MG TABLET
1.0000 | ORAL_TABLET | Freq: Every day | ORAL | 1 refills | Status: DC
Start: 2021-07-13 — End: 2021-11-02

## 2021-07-13 NOTE — Nursing Note (Signed)
Patient presents for a adult visit. Patient asks for flu shot today. Pt voices no issues or concerns at this time. Fredia Sorrow, LPN  [id number], 18:55

## 2021-07-13 NOTE — Progress Notes (Signed)
DERMATOLOGY, [facility]  [place] [address]  Operated by [place]     Name: Natasha Perez MRN:  [id number]   Date: 07/13/2021 Age: 50 y.o.     S: Natasha Perez is a 50 y.o. female presents for cosmetic procedure. Noticed some heaviness of right eyebrow after previous treatment. Interested in altering treatment to reduce this side effect.     O: dynamic rhytides appreciated in glabella and forehead     A/P:   #Encounter for cosmetic procedure    - Written consent obtained. Area cleaned with alcohol swab. 15 units of Botox injected into glabella. 6 units of Botox injected into forehead (1 unit per injection site on right forehead; 2 units per injection site on left). Two units injected into right lateral eyebrow. Patient charged $230 for cosmetic procedure. THIS IS A COSMETIC VISIT.  NO ADDITIONAL CHARGE TO BE GENERATED FOR THIS VISIT.        Corliss Skains, MD  Dermatology Resident, PGY-3        I saw and examined the patient.  I was present and supervised the entire procedure.  I reviewed the resident's note.  I agree with the findings and plan of care as documented in the resident's note.  Any exceptions/additions are edited/noted.    Dairl Ponder, MD 07/15/2021, 10:02

## 2021-07-13 NOTE — Progress Notes (Signed)
FAMILY MEDICINE, [place] [address]  Operated by [facility]     Name: Natasha Perez MRN:  [id number]   Date: 07/13/2021 Age: 50 y.o.        Date:   07/13/2021  Name: Natasha Perez  Age: 50 y.o.    Chief Complaint: Annual Exam    Subjective:  Review of Systems   Constitutional: Negative for chills, fever, malaise/fatigue and weight loss.   HENT: Negative for congestion, ear pain and sore throat.    Eyes: Negative for blurred vision, double vision, pain and discharge.   Respiratory: Negative for cough and shortness of breath.    Cardiovascular: Negative for chest pain, palpitations and leg swelling.   Gastrointestinal: Negative for abdominal pain, constipation and heartburn.   Genitourinary: Negative for dysuria and urgency.   Musculoskeletal: Negative for back pain, joint pain and myalgias.   Skin: Negative for rash.        Patient states she has follows with dermatology yearly due to past history of melanoma with no recurrence   Neurological: Negative for dizziness, weakness and headaches.   Psychiatric/Behavioral: Negative for depression. The patient is not nervous/anxious.         Current Outpatient Medications   Medication Sig    losartan-hydrochlorothiazide (HYZAAR) 50-12.5 mg Oral Tablet TAKE ONE TABLET BY MOUTH ONCE DAILY.    multivitamin Tab Take 1 Tablet by mouth Once a day    topiramate (TOPAMAX) 25 mg Oral Tablet Take 2 Tablets (50 mg total) by mouth Every evening First week take 1 tab nightly    valACYclovir (VALTREX) 500 mg Oral Tablet TAKE 1 TABLET BY MOUTH THREE TIMES A DAY       Objective:  BP 130/80   Pulse 71   Temp 36.4 C (97.5 F)   Ht 1.791 m (5' 10.5")   Wt 113 kg (250 lb)   SpO2 98%   BMI 35.36 kg/m       Physical Exam  Constitutional:       General: She is not in acute distress.     Appearance: She is obese. She is not ill-appearing.   HENT:      Head: Normocephalic and atraumatic.      Right  Ear: There is impacted cerumen.      Left Ear: Tympanic membrane, ear canal and external ear normal.      Nose: Nose normal. No rhinorrhea.      Mouth/Throat:      Mouth: Mucous membranes are moist.      Pharynx: No oropharyngeal exudate or posterior oropharyngeal erythema.   Eyes:      General:         Right eye: No discharge.         Left eye: No discharge.      Extraocular Movements: Extraocular movements intact.      Conjunctiva/sclera: Conjunctivae normal.      Pupils: Pupils are equal, round, and reactive to light.   Neck:      Vascular: No carotid bruit.   Cardiovascular:      Rate and Rhythm: Normal rate and regular rhythm.      Heart sounds: Normal heart sounds.   Pulmonary:      Effort: Pulmonary effort is normal. No respiratory distress.      Breath sounds: No wheezing.   Abdominal:      Palpations: Abdomen is soft.  Tenderness: There is no abdominal tenderness. There is no guarding or rebound.   Musculoskeletal:         General: Normal range of motion.      Cervical back: Normal range of motion.      Right lower leg: No edema.      Left lower leg: No edema.   Lymphadenopathy:      Cervical: No cervical adenopathy.   Skin:     Findings: No lesion or rash.   Neurological:      Mental Status: She is alert and oriented to person, place, and time.      Cranial Nerves: No cranial nerve deficit.      Motor: No weakness.      Gait: Gait normal.   Psychiatric:         Mood and Affect: Mood normal.         Behavior: Behavior normal.          Data reviewed:  COMPLETE BLOOD COUNT   Lab Results   Component Value Date    WBC 6.8 08/24/2020    HGB 15.7 08/24/2020    HCT 47.8 (H) 08/24/2020    PLTCNT 408 (H) 08/24/2020       DIFFERENTIAL  Lab Results   Component Value Date    PMNS 65 04/18/2016    LYMPHOCYTES 25 04/18/2016    MONOCYTES 8 04/18/2016    EOSINOPHIL 1 04/18/2016    BASOPHILS 1 04/18/2016    BASOPHILS 0.04 04/18/2016    PMNABS 4.86 04/18/2016    LYMPHSABS 1.87 04/18/2016    EOSABS 0.06 04/18/2016     MONOSABS 0.62 04/18/2016        Recent Results (from the past 17520 hour(s))   MAMMO BILATERAL SCREENING-ADDL VIEWS/BREAST US AS REQ BY RAD    Collection Time: 12/29/20  8:35 AM    Narrative    Computer aided detection (CAD) technology has been applied to the standard   (CC and MLO) images.    3D tomosynthesis and 2D images were acquired and reviewed    FILMS COMPARED:  Compared to: 12/20/2019 MAMMO BILATERAL SCREENING-ADDL VIEWS/BREAST US AS   REQ BY RAD, 03/10/2016 MAMMO BILATERAL SCREENING, 07/20/2014 MAMMO   BILATERAL SCREENING, 05/16/2013 MAMMO BILATERAL SCREENING, 01/11/2012   MAMMO DIAGNOSTIC BILATERAL, and 07/06/2011 MAMMO BILATERAL SCREENING    HISTORY:  Procedure: MAMMO BILATERAL SCREENING W TOMO-ADDL VIEWS/BREAST US AS REQ BY   RAD  Reason for exam: Screening for breast cancer      FINDINGS:  The breasts are heterogeneously dense, which may obscure small masses.    Bilateral  There is no evidence of suspicious masses, calcifications, or other   abnormal findings.      Impression    Follow up mammogram in 1 year is recommended for both breasts.    BI-RADS ATLAS category (overall): 1 - Negative           Social Determinants identified with potential adverse effects on health outcomes:  Rural or remote location       Assessment and Plan  1. Need for vaccination    2. Vitamin D deficiency    3. Impacted cerumen of right ear    4. HTN (hypertension)    5. Class 2 severe obesity due to excess calories with serious comorbidity and body mass index (BMI) of 35.0 to 35.9 in adult (CMS HCC)    6. Physical exam, pre-employment      1. Flu vaccine given  2. Assess vitamin-D  level  3. Cerumen irrigated clear by nurse  4. Stable with Hyzaar 50-12.5 mg 1 tablet daily  5. Labs associated with being overweight ordered  6. TB testing ordered    Olin Pia, DO

## 2021-07-14 LAB — HGA1C (HEMOGLOBIN A1C WITH EST AVG GLUCOSE)
ESTIMATED AVERAGE GLUCOSE: 97 mg/dL
HEMOGLOBIN A1C: 5 % (ref 4.0–5.6)

## 2021-07-15 LAB — QUANTIFERON TB MITOGEN: QFT MITOGEN: 10 IU/mL

## 2021-07-15 LAB — VITAMIN D 25 TOTAL: VITAMIN D, 25OH: 30 ng/mL (ref 30–100)

## 2021-07-15 LAB — QUANTIFERON: QFT QUALITATIVE: NEGATIVE

## 2021-07-15 LAB — QUANTIFERON TB1: QFT TB1: 0.09 [IU]/mL

## 2021-07-15 LAB — QUANTIFERON TB NIL: QFT NIL: 0.0139 [IU]/mL

## 2021-07-15 LAB — QUANTIFERON TB2: QFT TB2: 0.02 [IU]/mL

## 2021-08-12 ENCOUNTER — Encounter (HOSPITAL_BASED_OUTPATIENT_CLINIC_OR_DEPARTMENT_OTHER): Payer: Self-pay | Admitting: Family

## 2021-10-12 ENCOUNTER — Encounter (HOSPITAL_BASED_OUTPATIENT_CLINIC_OR_DEPARTMENT_OTHER): Payer: Self-pay | Admitting: Family

## 2021-10-13 ENCOUNTER — Other Ambulatory Visit (HOSPITAL_BASED_OUTPATIENT_CLINIC_OR_DEPARTMENT_OTHER): Payer: Self-pay | Admitting: Family

## 2021-10-13 ENCOUNTER — Encounter (HOSPITAL_BASED_OUTPATIENT_CLINIC_OR_DEPARTMENT_OTHER): Payer: Self-pay | Admitting: Family

## 2021-10-13 DIAGNOSIS — R221 Localized swelling, mass and lump, neck: Secondary | ICD-10-CM

## 2021-10-13 DIAGNOSIS — L509 Urticaria, unspecified: Secondary | ICD-10-CM

## 2021-10-13 MED ORDER — HYDROCORTISONE 2.5 % TOPICAL OINTMENT
TOPICAL_OINTMENT | Freq: Two times a day (BID) | CUTANEOUS | 0 refills | Status: DC
Start: 2021-10-13 — End: 2022-09-01

## 2021-10-17 ENCOUNTER — Ambulatory Visit (HOSPITAL_BASED_OUTPATIENT_CLINIC_OR_DEPARTMENT_OTHER): Payer: Self-pay | Admitting: Pediatric Allergy/Immunology

## 2021-10-31 ENCOUNTER — Encounter (HOSPITAL_BASED_OUTPATIENT_CLINIC_OR_DEPARTMENT_OTHER): Payer: Self-pay | Admitting: Pediatric Allergy/Immunology

## 2021-10-31 ENCOUNTER — Ambulatory Visit: Payer: 59 | Attending: Pediatric Allergy/Immunology | Admitting: Pediatric Allergy/Immunology

## 2021-10-31 DIAGNOSIS — J309 Allergic rhinitis, unspecified: Secondary | ICD-10-CM | POA: Insufficient documentation

## 2021-10-31 DIAGNOSIS — H1010 Acute atopic conjunctivitis, unspecified eye: Secondary | ICD-10-CM | POA: Insufficient documentation

## 2021-10-31 DIAGNOSIS — L309 Dermatitis, unspecified: Secondary | ICD-10-CM | POA: Insufficient documentation

## 2021-10-31 NOTE — Progress Notes (Signed)
ALLERGY/IMMUNOLOGY, [facility]  [place] [address]  Operated by [facility]  Video Visit     Name: Natasha Perez  MRN: [id number]    Date: 10/31/2021  Age: 51 y.o.                            Patient's location: [place] [address]   Patient/family aware of provider location: Yes  Patient/family consent for video visit: Yes  Interview and observation performed by: Dayna Barker, MD, located at [facility] in [place], [place].    Chief Complaint: Allergies    History of Present Illness:  Natasha Perez is a 51 y.o. female.  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  She started wearing new masks and developed an itchy red skin rash on her face and neck in the distribution of the masks that she was wearing.  She also developed an itchy red skin rash on her hands after wearing latex gloves.  There is no history of breathing or swallowing difficulty or anaphylaxis.  Treatment has included steroids and hydroxyzine with some benefit.  She changed to a cloth mask and improved.  More recently, she has been wearing a face shield and no mask and has been improving even further.  She has a visit scheduled with Dermatology tomorrow for possible patch allergy skin testing.  She has used triamcinolone cream and Benadryl as well.  There is a history of infrequent episodes of runny nose, sneezing, nasal congestion, and itching of the eyes.  Ingestion of shrimp in the past caused severe nasal congestion.  Shrimp is avoided in her diet.  There is a history of hypertension.    There is no mold or tobacco smoke exposure.    Past Medical History:  She has a past medical history of HTN (hypertension) (02/16/2021) and Plantar fasciitis.    She has no past medical history of Asthma, Breast CA (CMS HCC), Cervical cancer (CMS HCC), Chronic pain, Clotting disorder (CMS HCC), Colon cancer (CMS HCC), Congenital anomaly of heart, CVA  (cerebrovascular accident) (CMS [place]), Cystic fibrosis (CMS [place]), Deep vein thrombosis (DVT) (CMS HCC), Dysplasia of cervix, Endometrial cancer (CMS [place]), Esophageal reflux, Female infertility, Headache(784.0), Heart disease, Heart murmur, Hepatic cirrhosis (CMS HCC), breast cancer, Kidney disease, Liver disease, Malignant hyperthermia, Osteoporosis, Ovarian cancer (CMS [place]), Painful intercourse, Postpartum depression, Pseudocholinesterase deficiency, Psychiatric problem, Pulmonary embolism (CMS [place]), Rectal cancer (CMS [place]), Seizure (CMS [place]), STD (sexually transmitted disease), Thyroid disorder, Treatment, Type I diabetes mellitus (CMS [place]), Type II or unspecified type diabetes mellitus without mention of complication, not stated as uncontrolled, Unspecified breast disorder, Unspecified disorder of thyroid, Uterine cancer (CMS [place]), Vaginal infection, Varicosities, Viral hepatitis, Viral hepatitis B, or Viral hepatitis C.    Past Surgical History:  She has a past surgical history that includes hx other; hx myomectomy (2005); hx refractive surgery (2000); and hx breast biopsy (Right).    Problem List:  She has History of melanoma; Fibroid; Vitamin D deficiency; Infertility management; History of ectopic pregnancy; HTN (hypertension); and Class 2 obesity with body mass index (BMI) of 35.0 to 35.9 in adult on their problem list.    Medications:  .  hydrocortisone  .  losartan-hydrochlorothiazide  .  multivitamin  .  triamcinolone acetonide     Review of Systems:  Review of systems: Constitutional:  Negative for fever, decreased  activity, decreased appetite, and lethargy.  Skin:  Negative for rash, except as noted above.  HEENT:  Negative for purulent rhinorrhea, ear pain and sore throat.  Respiratory:  Negative for chronic cough, croup, dyspnea and wheezing.  Cardiovascular:  Negative for tachycardia, cyanosis, pallor and palpitations.  Gastrointestinal:  Negative for abdominal pain, vomiting and diarrhea.  Renal:   Negative for urinary frequency.  Neurological:  Negative for headaches.  Observational Exam:   Facial and neck erythema were observed.  There was no swelling.   No runny nose or redness of the eyes were observed.  Well-developed, well-nourished, well-appearing, and in no respiratory distress.    Cellphone pictures were shown to me by the patient and were consistent with severe erythema involving the skin of the face and neck in a mask-like distribution.    Data Reviewed:   The results of blood tests conducted previously were reviewed and were consistent with a normal total white blood cell count.    Assessment/Plan:  Impressions include possible latex allergy, probable contact dermatitis, possible shrimp food allergy, allergic rhinitis, and allergic conjunctivitis.  There is also history of hypertension, which complicates evaluation and management.    I recommended continuing to use a face shield at work instead of wearing a mask and avoiding contact with rubber gloves and masks.  I also recommended blood testing for food allergy, environmental allergy, and latex allergy, and will consider patch allergy skin testing in the future.  Otherwise, I recommended the same therapy.  A return visit was scheduled for as needed for revaluation by video visit.      ICD-10-CM    1. Dermatitis  L30.9 RAST, LATEX (K82)     RAST, DOG DANDER (E5)     TOTAL IMMUNOGLOBULIN IGE     ALLERGEN, ALTERNARIA ALTERNATA     ALLERGEN, ASPERGILLUS FUMIGATUS     ALLERGEN, BIRCH TREE     ALLERGEN, [place] GRASS     ALLERGEN, COCKLEBUR     ALLERGEN, COCKROACH (GERMAN)     ALLERGEN, CLADOSPORIUM HERBARUM     ALLERGEN, COMMON RAGWEED (SHORT)     ALLERGEN, [place] TREE     ALLERGEN, CAT PELT DANDER/EPITHELIUM     ALLERGEN, DERMAT FARINAE (MITES)     ALLERGEN, ELM TREE     ALLERGEN, ENGLISH PLANTAIN     ALLERGEN, JOHNSON GRASS     ALLERGEN, [place] BLUE GRASS (JUNE)     LAMB'S QUARTERS(CHENOPODIUM), IGE     ALLERGEN, MAPLE (BOX ELDER)     ALLERGEN,  MUGWORT     ALLERGEN, MOUSE EPITHELIUM     ALLERGEN, [place]AK TREE (WHITE AND RED)     ALLERGEN, ORCHARD GRASS     ALLERGEN, PENICILLIUM NOTATUM     ALLERGEN, PIGWEED     ALLERGEN, RED SORREL, SHEEP     ALLERGEN, TIMOTHY GRASS     ALLERGEN, EGG WHITE     ALLERGEN, EGG YOLK     ALLERGEN, MILK     ALLERGEN, PEANUT     ALLERGEN, SOYBEAN     ALLERGEN, WHEAT     RAST, SHRIMP (F24)     RAST, TUNA (F40)     RAST, WALNUT (F256)      2. Allergic rhinoconjunctivitis  J30.9 RAST, LATEX (K82)    H10.10 RAST, DOG DANDER (E5)     TOTAL IMMUNOGLOBULIN IGE     ALLERGEN, ALTERNARIA ALTERNATA     ALLERGEN, ASPERGILLUS FUMIGATUS     ALLERGEN, BIRCH TREE     ALLERGEN, [place] GRASS  ALLERGEN, COCKLEBUR     ALLERGEN, COCKROACH (GERMAN)     ALLERGEN, CLADOSPORIUM HERBARUM     ALLERGEN, COMMON RAGWEED (SHORT)     ALLERGEN, [place] TREE     ALLERGEN, CAT PELT DANDER/EPITHELIUM     ALLERGEN, DERMAT FARINAE (MITES)     ALLERGEN, ELM TREE     ALLERGEN, ENGLISH PLANTAIN     ALLERGEN, JOHNSON GRASS     ALLERGEN, [place] BLUE GRASS (JUNE)     LAMB'S QUARTERS(CHENOPODIUM), IGE     ALLERGEN, MAPLE (BOX ELDER)     ALLERGEN, MUGWORT     ALLERGEN, MOUSE EPITHELIUM     ALLERGEN, OAK TREE (WHITE AND RED)     ALLERGEN, ORCHARD GRASS     ALLERGEN, PENICILLIUM NOTATUM     ALLERGEN, PIGWEED     ALLERGEN, RED SORREL, SHEEP     ALLERGEN, TIMOTHY GRASS     ALLERGEN, EGG WHITE     ALLERGEN, EGG YOLK     ALLERGEN, MILK     ALLERGEN, PEANUT     ALLERGEN, SOYBEAN     ALLERGEN, WHEAT     RAST, SHRIMP (F24)     RAST, TUNA (F40)     RAST, WALNUT (F256)        Orders Placed This Encounter   . RAST, LATEX (K82)   . RAST, DOG DANDER (E5)   . TOTAL IMMUNOGLOBULIN IGE   . ALLERGEN, ALTERNARIA ALTERNATA   . ALLERGEN, ASPERGILLUS FUMIGATUS   . ALLERGEN, BIRCH TREE   . ALLERGEN, [place] GRASS   . ALLERGEN, COCKLEBUR   . ALLERGEN, COCKROACH (GERMAN)   . ALLERGEN, CLADOSPORIUM HERBARUM   . ALLERGEN, COMMON RAGWEED (SHORT)   . ALLERGEN, [place] ALLERGEN, CAT PELT  DANDER/EPITHELIUM   . ALLERGEN, DERMAT FARINAE (MITES)   . ALLERGEN, ELM TREE   . ALLERGEN, ENGLISH PLANTAIN   . ALLERGEN, JOHNSON GRASS   . ALLERGEN, [place] BLUE GRASS (JUNE)   . LAMB'S QUARTERS(CHENOPODIUM), IGE   . ALLERGEN, MAPLE (BOX ELDER)   . ALLERGEN, MUGWORT   . ALLERGEN, MOUSE EPITHELIUM   . ALLERGEN, OAK TREE (WHITE AND RED)   . ALLERGEN, ORCHARD GRASS   . ALLERGEN, PENICILLIUM NOTATUM   . ALLERGEN, PIGWEED   . ALLERGEN, RED SORREL, SHEEP   . ALLERGEN, TIMOTHY GRASS   . ALLERGEN, EGG WHITE   . ALLERGEN, EGG YOLK   . ALLERGEN, MILK   . ALLERGEN, PEANUT   . ALLERGEN, SOYBEAN   . ALLERGEN, WHEAT   . RAST, SHRIMP (F24)   . RAST, TUNA (F40)   . RAST, WALNUT (F256)     Follow Up:  as needed    Dayna Barker, MD    I personally offered the service to the patient, and obtained verbal consent to provide this service.    Dayna Barker, MD

## 2021-11-01 ENCOUNTER — Other Ambulatory Visit: Payer: Self-pay

## 2021-11-01 ENCOUNTER — Ambulatory Visit: Payer: 59 | Attending: Family | Admitting: Family

## 2021-11-01 ENCOUNTER — Other Ambulatory Visit (INDEPENDENT_AMBULATORY_CARE_PROVIDER_SITE_OTHER): Payer: 59 | Admitting: Rheumatology

## 2021-11-01 ENCOUNTER — Encounter (HOSPITAL_BASED_OUTPATIENT_CLINIC_OR_DEPARTMENT_OTHER): Payer: Self-pay | Admitting: Family

## 2021-11-01 VITALS — Temp 96.8°F | Ht 70.5 in | Wt 259.5 lb

## 2021-11-01 DIAGNOSIS — R21 Rash and other nonspecific skin eruption: Secondary | ICD-10-CM | POA: Insufficient documentation

## 2021-11-01 DIAGNOSIS — H1010 Acute atopic conjunctivitis, unspecified eye: Secondary | ICD-10-CM | POA: Insufficient documentation

## 2021-11-01 DIAGNOSIS — Z780 Asymptomatic menopausal state: Secondary | ICD-10-CM

## 2021-11-01 DIAGNOSIS — E559 Vitamin D deficiency, unspecified: Secondary | ICD-10-CM | POA: Insufficient documentation

## 2021-11-01 DIAGNOSIS — J309 Allergic rhinitis, unspecified: Secondary | ICD-10-CM | POA: Insufficient documentation

## 2021-11-01 DIAGNOSIS — Z6835 Body mass index (BMI) 35.0-35.9, adult: Secondary | ICD-10-CM | POA: Insufficient documentation

## 2021-11-01 DIAGNOSIS — L309 Dermatitis, unspecified: Secondary | ICD-10-CM | POA: Insufficient documentation

## 2021-11-01 LAB — CBC WITH DIFF
BASOPHIL #: <0.1 10*3/uL (ref <=0.20)
BASOPHIL %: 1 %
EOSINOPHIL #: <0.1 10*3/uL (ref <=0.50)
EOSINOPHIL %: 1 %
HCT: 41.2 % (ref 34.8–46.0)
HGB: 13.3 g/dL (ref 11.5–16.0)
IMMATURE GRANULOCYTE #: <0.1 10*3/uL (ref <0.10)
IMMATURE GRANULOCYTE %: 0 % (ref 0–1)
LYMPHOCYTE #: 1.94 10*3/uL (ref 1.00–4.80)
LYMPHOCYTE %: 23 %
MCH: 26.5 pg (ref 26.0–32.0)
MCHC: 32.3 g/dL (ref 31.0–35.5)
MCV: 82.1 fL (ref 78.0–100.0)
MONOCYTE #: 0.68 10*3/uL (ref 0.20–1.10)
MONOCYTE %: 8 %
MPV: 8.5 fL — ABNORMAL LOW (ref 8.7–12.5)
NEUTROPHIL #: 5.77 10*3/uL (ref 1.50–7.70)
NEUTROPHIL %: 67 %
PLATELETS: 408 10*3/uL — ABNORMAL HIGH (ref 150–400)
RBC: 5.02 10*6/uL (ref 3.85–5.22)
RDW-CV: 14.2 % (ref 11.5–15.5)
WBC: 8.6 10*3/uL (ref 3.7–11.0)

## 2021-11-01 LAB — COMPREHENSIVE METABOLIC PANEL, NON-FASTING
ALBUMIN: 3.6 g/dL (ref 3.5–5.0)
ALKALINE PHOSPHATASE: 91 U/L (ref 50–130)
ALT (SGPT): 13 U/L (ref 8–22)
ANION GAP: 11 mmol/L (ref 4–13)
AST (SGOT): 17 U/L (ref 8–45)
BILIRUBIN TOTAL: 0.3 mg/dL (ref 0.3–1.3)
BUN/CREA RATIO: 9 (ref 6–22)
BUN: 7 mg/dL — ABNORMAL LOW (ref 8–25)
CALCIUM: 8.9 mg/dL (ref 8.5–10.0)
CHLORIDE: 106 mmol/L (ref 96–111)
CO2 TOTAL: 23 mmol/L (ref 22–30)
CREATININE: 0.8 mg/dL (ref 0.60–1.05)
ESTIMATED GFR: 89 mL/min/BSA (ref >=60)
GLUCOSE: 83 mg/dL (ref 65–125)
POTASSIUM: 3.4 mmol/L — ABNORMAL LOW (ref 3.5–5.1)
PROTEIN TOTAL: 6.8 g/dL (ref 6.4–8.3)
SODIUM: 140 mmol/L (ref 136–145)

## 2021-11-01 LAB — LH: LUTEINIZING HORMONE: 4.2 m[IU]/mL

## 2021-11-01 LAB — ESTRADIOL: ESTRADIOL: 136 pg/mL

## 2021-11-01 LAB — HGA1C (HEMOGLOBIN A1C WITH EST AVG GLUCOSE)
ESTIMATED AVERAGE GLUCOSE: 97 mg/dL
HEMOGLOBIN A1C: 5 % (ref 4.0–5.6)

## 2021-11-01 LAB — CREATINE KINASE (CK), TOTAL, SERUM: CREATINE KINASE: 71 U/L (ref 25–190)

## 2021-11-01 LAB — FSH: FSH: 3.1 m[IU]/mL

## 2021-11-01 MED ORDER — PIMECROLIMUS 1 % TOPICAL CREAM
TOPICAL_CREAM | Freq: Two times a day (BID) | CUTANEOUS | 1 refills | Status: DC
Start: 2021-11-01 — End: 2022-10-04

## 2021-11-01 MED ORDER — TRIAMCINOLONE ACETONIDE 0.1 % TOPICAL OINTMENT
TOPICAL_OINTMENT | Freq: Two times a day (BID) | CUTANEOUS | 0 refills | Status: DC
Start: 2021-11-01 — End: 2022-10-04

## 2021-11-01 MED ORDER — HYDROCORTISONE 2.5 % TOPICAL CREAM
TOPICAL_CREAM | Freq: Two times a day (BID) | CUTANEOUS | 0 refills | Status: DC
Start: 2021-11-01 — End: 2022-09-01

## 2021-11-01 NOTE — Progress Notes (Signed)
Dermatology Clinic, [facility]  [place] [address]  [id number]    Date:   11/05/2021  Name: Natasha Perez  Age: 51 y.o.    Chief complaint: Rash      HPI    Natasha Perez is a 51 y.o.    White  Other female presenting for a Rash. Patient states that she started a new job on November 7th. States on Nov. 21 she noticed a rash on her neck. States that she suspected it was from the mask. States the areas was very itchy. She was given topical steroids and rash improved. Also noted rash improved when she had days off from work and didn't have to wear a mask. States rash worsened and she had lip swelling. States in January she was given oral steroids and rash resolved. Has been off the steroids for a couple of weeks and rash has returned on face. Also notes she used different gloves at work and now has a rash on her hands. States that she saw an allergist on 2/6, but haven't obtained the lab work. Here for treatment.   No other skin related concerns.       Review of Systems   Constitutional: Negative for chills and fever.   Musculoskeletal: Negative for back pain.   Skin: Positive for itching and rash.   Psychiatric/Behavioral: Negative for depression and suicidal ideas.       Current Medications  Outpatient Encounter Medications as of 11/01/2021   Medication Sig Dispense Refill    hydrocortisone 2.5 % Cream Apply topically Twice daily 28 g 0    hydrocortisone 2.5 % Ointment Apply topically Twice daily 28.35 g 0    multivitamin Tab Take 1 Tablet by mouth Once a day  0    pimecrolimus (ELIDEL) 1 % Cream Apply topically Twice daily 30 g 1    triamcinolone acetonide (ARISTOCORT A) 0.1 % Ointment Apply topically Twice daily 80 g 0    [EXPIRED] valACYclovir (VALTREX) 500 mg Oral Tablet TAKE 1 TABLET BY MOUTH THREE TIMES A DAY 90 Tablet 0    [DISCONTINUED] lisinopriL (PRINIVIL) 10 mg Oral Tablet Take 1 Tablet (10 mg total) by mouth Twice daily for 30 days 90 Tablet  3    [DISCONTINUED] losartan (COZAAR) 25 mg Oral Tablet Take 1 Tablet (25 mg total) by mouth Once a day 90 Tablet 1    [DISCONTINUED] losartan-hydrochlorothiazide (HYZAAR) 50-12.5 mg Oral Tablet TAKE ONE TABLET BY MOUTH ONCE DAILY. 90 Tablet 1    [DISCONTINUED] topiramate (TOPAMAX) 25 mg Oral Tablet Take 2 Tablets (50 mg total) by mouth Every evening First week take 1 tab nightly 180 Tablet 3    [DISCONTINUED] triamcinolone acetonide (ARISTOCORT A) 0.1 % Cream Apply topically Twice per day as needed 80 g 1     No facility-administered encounter medications on file as of 11/01/2021.        Allergies   Allergen Reactions    Iodine And Iodide Containing Products Itching, Swelling and Hives/ Urticaria    Sulfa (Sulfonamides) Nausea/ Vomiting        Past Medical History:   Diagnosis Date    HTN (hypertension) 02/16/2021    Plantar fasciitis             Patient Active Problem List    Diagnosis Date Noted    HTN (hypertension) 02/16/2021    Class 2 obesity with body mass index (BMI) of 35.0 to 35.9 in adult 02/16/2021  Infertility management 08/12/2013    History of ectopic pregnancy 08/12/2013    History of melanoma 05/27/2010    Fibroid 05/27/2010    Vitamin D deficiency 05/27/2010         Physical Exam  Vitals:        Vitals:    11/01/21 1543   Temp: 36 C (96.8 F)   Weight: 118 kg (259 lb 7.7 oz)   Height: 1.791 m (5' 10.5")          Physical Exam  Constitutional:       General: She is not in acute distress.     Appearance: Normal appearance.   HENT:      Head:     Eyes:      General: Lids are normal.   Skin:     General: Skin is warm and dry.      Coloration: Skin is not pale.          Neurological:      Mental Status: She is alert and oriented to person, place, and time. Mental status is at baseline.      Coordination: Coordination normal.   Psychiatric:         Mood and Affect: Mood normal.              Assessment and Plan  1. Rash Favor Allergic Contact dermatitis vs Atopic Dermatitis vs Dermatomyositis (see #1 on  head and body diagram) - new  Ordered:  - CREATINE KINASE (CK), TOTAL, SERUM  - ALDOLASE, SERUM   -Thoroughly discussed with patient about avoiding surgical masks. Discussed wearing cloth mask  -Reviewed all patient photos from phone  -Reviewed notes from Allergist from 10/31/2021  -Start Elidel cream twice daily. Prescription given.  -Continue hydrocortisone 2.5% ointment twice daily.   -Start Triamcinolone 0.1% Ointment to hand twice daily as needed. Prescription given. We discussed the potential adverse effects of prolonged topical steroid use, including skin atrophy, dyspigmentation, telangiectasias, striae and acne.  -Discussed patch testing with patient to be performed when she returns from her work assignment in May 2023.   -Written work excuse given to wear cloth mask.         The patient was educated on the importance of avoiding excessive sun exposure and wearing sunscreen daily SPF 30 or higher.  Advised patient to re-apply sunscreen every 2-3 hours.  Advised the patient to avoid going to and using tanning beds.  Advised to check skin routinely for any changes, especially any new moles or changes in existing moles.     Return in about 3 months (around 01/29/2022).    This is a shared visit with Dr. Amedeo Gory.     Time: I spent 20 minutes with this patient    Jamas Lav, APRN,FNP-BC  11/01/2021, 23:52    Time:  12 minutes    I personally saw and evaluated the patient. See mid-level's note for additional details. My findings/participation are facial rash - favor contact dermatitis (likely from mask) after reviewing patient provided pictures. CPK/aldolase to rule out DM.    Dossie Der, MD

## 2021-11-02 ENCOUNTER — Telehealth (INDEPENDENT_AMBULATORY_CARE_PROVIDER_SITE_OTHER): Payer: Self-pay | Admitting: Family Medicine

## 2021-11-02 ENCOUNTER — Other Ambulatory Visit (INDEPENDENT_AMBULATORY_CARE_PROVIDER_SITE_OTHER): Payer: Self-pay | Admitting: Family Medicine

## 2021-11-02 DIAGNOSIS — I10 Essential (primary) hypertension: Secondary | ICD-10-CM

## 2021-11-02 LAB — VITAMIN D 25 TOTAL: VITAMIN D, 25OH: 28 ng/mL — ABNORMAL LOW (ref 30–100)

## 2021-11-02 MED ORDER — POTASSIUM CHLORIDE ER 10 MEQ TABLET,EXTENDED RELEASE(PART/CRYST)
10.0000 meq | ORAL_TABLET | Freq: Every day | ORAL | 3 refills | Status: DC
Start: 2021-11-02 — End: 2021-12-12

## 2021-11-02 MED ORDER — LOSARTAN 50 MG-HYDROCHLOROTHIAZIDE 12.5 MG TABLET
1.0000 | ORAL_TABLET | Freq: Every day | ORAL | 3 refills | Status: DC
Start: 2021-11-02 — End: 2021-12-12

## 2021-11-02 NOTE — Telephone Encounter (Signed)
-----   Message from Olin Pia, DO sent at 11/02/2021  9:39 AM EST -----  Potassium is low at 3.4 suggest starting potassium chloride 10 mEq 1 tablet daily.  Potassium is low due to blood pressure medication.  Platelets are stable.  No blood work signs of diabetes.  Kidney and liver function are normal

## 2021-11-03 ENCOUNTER — Encounter (HOSPITAL_BASED_OUTPATIENT_CLINIC_OR_DEPARTMENT_OTHER): Payer: Self-pay | Admitting: Family

## 2021-11-03 LAB — RAST, TIMOTHY GRASS (G6)
TIMOTHY GRASS (G6) INTERPRETATION: UNDETECTABLE
TIMOTHY GRASS (G6): <0.1 kU/L (ref <0.10)

## 2021-11-03 LAB — RAST, PEANUT WITH REFLEX TO PEANUT COMPONENTS (F13)
PEANUTS (F13) INTERPRETATION: UNDETECTABLE
PEANUTS (F13): <0.1 kU/L (ref <0.10)

## 2021-11-03 LAB — RAST, LATEX (K82)
LATEX (K82): <0.1 kU/L (ref <0.10)
Latex (k82) Interpretation: UNDETECTABLE

## 2021-11-03 LAB — RAST, EGG WHITE (F1)
EGG WHITE (F1) INTERPRETATION: UNDETECTABLE
EGG WHITE (F1): <0.1 kU/L (ref <0.10)

## 2021-11-03 LAB — RAST, MILK (F2)
MILK (F2) INTERPRETATION: UNDETECTABLE
MILK (F2): <0.1 kU/L (ref <0.10)

## 2021-11-03 LAB — RAST, CAT DANDER (E1)
CAT DANDER (E1) INTERPRETATION: UNDETECTABLE
CAT DANDER (E1): <0.1 kU/L (ref <0.10)

## 2021-11-03 LAB — RAST, ORCHARD GRASS (COCKSFOOT) (G3)
ORCHARD GRASS (COCKSFOOT) (G3) INTERPRETATION: UNDETECTABLE
ORCHARD GRASS (COCKSFOOT) (G3): <0.1 kU/L (ref <0.10)

## 2021-11-03 LAB — RAST, WALNUT (F256)
WALNUT (F256) INTERPRETATION: UNDETECTABLE
WALNUT (F256): <0.1 kU/L (ref <0.10)

## 2021-11-03 LAB — RAST, EGG YOLK (F75)
EGG YOLK (F75) INTERPRETATION: UNDETECTABLE
EGG YOLK (F75): <0.1 kU/L (ref <0.10)

## 2021-11-03 LAB — RAST, MEADOW GRASS (KENTUCKY BLUE) (G8)
MEADOW GRASS (KENTUCKY BLUE) (G8) INTERPRETATIOn: UNDETECTABLE
MEADOW GRASS (KENTUCKY BLUE) (G8): <0.1 kU/L (ref <0.10)

## 2021-11-03 LAB — RAST, PENICILLIUM CHRYSOGENUM (M1)
PENICILLIUM CHRYSOGENUM (M1) INTERPRETATION: UNDETECTABLE
PENICILLIUM CHRYSOGENUM (M1): <0.1 kU/L (ref <0.10)

## 2021-11-03 LAB — RAST, COCKROACH, GERMAN (I6)
COCKROACH, GERMAN (I6) INTERPRETATION: UNDETECTABLE
COCKROACH, GERMAN (I6): <0.1 kU/L (ref <0.10)

## 2021-11-03 LAB — RAST, DOG DANDER (E5)
DOG DANDER (E5) INTERPRETATION: UNDETECTABLE
DOG DANDER (E5): <0.1 kU/L (ref <0.10)

## 2021-11-03 LAB — RAST, MOUSE EPITHELIUM (E71)
MOUSE EPITHELIUM (E71) INTERPRETATION: UNDETECTABLE
MOUSE EPITHELIUM (E71): <0.1 kU/L (ref <0.10)

## 2021-11-03 LAB — RAST, SOYBEAN (F14)
SOYBEAN (F14) INTERPRETATION: UNDETECTABLE
SOYBEAN (F14): <0.1 kU/L (ref <0.10)

## 2021-11-03 LAB — RAST, JOHNSON GRASS (G10)
JOHNSON GRASS (G10) INTERPRETATION: UNDETECTABLE
JOHNSON GRASS (G10): <0.1 kU/L (ref <0.10)

## 2021-11-03 LAB — RAST, HOUSE DUST MITE (D2) (DERMATOPHAGOIDES FARINAE)
HOUSE DUST MITE (D2) (DERMATOPHAGOIDES FARINAE) INTERPRETATION: UNDETECTABLE
HOUSE DUST MITE (D2) (DERMATOPHAGOIDES FARINAE): <0.1 kU/L (ref <0.10)

## 2021-11-03 LAB — RAST, CLADOSPORIUM HERBARUM (M2)
CLADOSPORIUM HERBARUM (M2) INTERPRETATION: UNDETECTABLE
CLADOSPORIUM HERBARUM (M2): <0.1 kU/L (ref <0.10)

## 2021-11-03 LAB — RAST, WHEAT (F4)
WHEAT (F4) INTERPRETATION: UNDETECTABLE
WHEAT (F4): <0.1 kU/L (ref <0.10)

## 2021-11-03 LAB — RAST, TUNA (F40)
TUNA (F40) INTERPRETATION: UNDETECTABLE
TUNA (F40): <0.1 kU/L (ref <0.10)

## 2021-11-03 LAB — RAST, BERMUDA GRASS (G2)
BERMUDA GRASS (G2) INTERPRETATION: UNDETECTABLE
BERMUDA GRASS (G2): <0.1 kU/L (ref <0.10)

## 2021-11-03 LAB — IGE, TOTAL IMMUNOGLOBULIN E: IgE: 9.94 kU/L

## 2021-11-03 LAB — RAST, SHRIMP (F24)
SHRIMP (F24) INTERPRETATION: UNDETECTABLE
SHRIMP (F24): <0.1 kU/L (ref <0.10)

## 2021-11-03 NOTE — Nursing Note (Signed)
PA submitted to [employer] via phone call [phone]) for Pimecrolimus 1% Cream. 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 Awaiting decision from insurance via fax. [place], [place]  11/03/2021, 10:36

## 2021-11-06 LAB — ALDOLASE, SERUM: ALDOLASE: 5.6 U/L (ref <=8.1)

## 2021-11-18 ENCOUNTER — Encounter (HOSPITAL_BASED_OUTPATIENT_CLINIC_OR_DEPARTMENT_OTHER): Payer: Self-pay | Admitting: Pediatric Allergy/Immunology

## 2021-12-12 ENCOUNTER — Other Ambulatory Visit (INDEPENDENT_AMBULATORY_CARE_PROVIDER_SITE_OTHER): Payer: Self-pay | Admitting: Family Medicine

## 2021-12-12 DIAGNOSIS — I10 Essential (primary) hypertension: Secondary | ICD-10-CM

## 2021-12-12 MED ORDER — LOSARTAN 50 MG-HYDROCHLOROTHIAZIDE 12.5 MG TABLET
1.0000 | ORAL_TABLET | Freq: Every day | ORAL | 3 refills | Status: DC
Start: 2021-12-12 — End: 2022-07-25

## 2021-12-12 MED ORDER — POTASSIUM CHLORIDE ER 10 MEQ TABLET,EXTENDED RELEASE(PART/CRYST)
10.0000 meq | ORAL_TABLET | Freq: Every day | ORAL | 3 refills | Status: DC
Start: 2021-12-12 — End: 2022-10-04

## 2021-12-12 NOTE — Telephone Encounter (Signed)
-----   Message from [place]. Lorel Monaco sent at 12/12/2021  9:28 AM EDT -----  Regarding: Medication Renewal Request  Contact: [phone]  Refills have been requested for the following medications:        potassium chloride (K-DUR) 10 mEq Oral Tab Sust.Rel. Particle/Crystal [name]]        losartan-hydrochlorothiazide (HYZAAR) 50-12.5 mg Oral Tablet [[name]]    Preferred pharmacy: [facility]  Delivery method: Arlyss Gandy

## 2022-01-02 ENCOUNTER — Encounter (HOSPITAL_BASED_OUTPATIENT_CLINIC_OR_DEPARTMENT_OTHER): Payer: Self-pay | Admitting: Family

## 2022-01-06 ENCOUNTER — Ambulatory Visit (HOSPITAL_BASED_OUTPATIENT_CLINIC_OR_DEPARTMENT_OTHER): Payer: Medicare (Managed Care)

## 2022-01-11 ENCOUNTER — Encounter (HOSPITAL_BASED_OUTPATIENT_CLINIC_OR_DEPARTMENT_OTHER): Payer: Self-pay | Admitting: DERMATOLOGY

## 2022-01-11 ENCOUNTER — Ambulatory Visit (HOSPITAL_BASED_OUTPATIENT_CLINIC_OR_DEPARTMENT_OTHER): Payer: Medicare (Managed Care)

## 2022-01-11 ENCOUNTER — Other Ambulatory Visit: Payer: Self-pay

## 2022-01-11 ENCOUNTER — Encounter (INDEPENDENT_AMBULATORY_CARE_PROVIDER_SITE_OTHER): Payer: Self-pay | Admitting: Family Medicine

## 2022-02-06 ENCOUNTER — Other Ambulatory Visit: Payer: Self-pay

## 2022-02-06 ENCOUNTER — Ambulatory Visit (HOSPITAL_BASED_OUTPATIENT_CLINIC_OR_DEPARTMENT_OTHER): Payer: 59

## 2022-02-17 ENCOUNTER — Ambulatory Visit (HOSPITAL_BASED_OUTPATIENT_CLINIC_OR_DEPARTMENT_OTHER): Payer: Medicare (Managed Care)

## 2022-02-17 ENCOUNTER — Ambulatory Visit: Payer: 59 | Attending: PHYSICIAN ASSISTANT

## 2022-02-17 ENCOUNTER — Other Ambulatory Visit: Payer: Self-pay

## 2022-02-17 ENCOUNTER — Encounter (HOSPITAL_BASED_OUTPATIENT_CLINIC_OR_DEPARTMENT_OTHER): Payer: Self-pay

## 2022-02-17 ENCOUNTER — Inpatient Hospital Stay (HOSPITAL_BASED_OUTPATIENT_CLINIC_OR_DEPARTMENT_OTHER)
Admission: RE | Admit: 2022-02-17 | Discharge: 2022-02-17 | Disposition: A | Discharge status: Home / Self Care | Payer: 59 | Source: Ambulatory Visit

## 2022-02-17 DIAGNOSIS — Z1239 Encounter for other screening for malignant neoplasm of breast: Secondary | ICD-10-CM

## 2022-02-17 DIAGNOSIS — Z1231 Encounter for screening mammogram for malignant neoplasm of breast: Secondary | ICD-10-CM

## 2022-02-17 DIAGNOSIS — Z23 Encounter for immunization: Secondary | ICD-10-CM | POA: Insufficient documentation

## 2022-02-17 NOTE — Nursing Note (Signed)
02/17/22 1500   Covid-19 Vaccine Questions   Which dose are you administering ? Booster   Do you have an active COVID infection ? N   Have you received COVID antibodies in the past 90 days ? N   Have you had a severe allergic reaction to vaccines in the past ( severe hives, swelling, shortness of breath, treatment with EpiPen or hospitalization? N       Immunization administered       Name Date Dose VIS Date Route    Covid-19 Vaccine,Pfizer Bivalent,53mg/0.3ml,12 yrs+ 02/17/2022 0.3 mL 01/20/2022 Intramuscular    Site: Right deltoid    Given By: MDeloria Lair RN    Manufacturer: Pfizer-BioNTech    Lot:: [id number]   NGunter: [id number]         Alaisha Eversley, RN

## 2022-02-28 ENCOUNTER — Encounter (HOSPITAL_BASED_OUTPATIENT_CLINIC_OR_DEPARTMENT_OTHER): Payer: Self-pay | Admitting: Family

## 2022-03-01 ENCOUNTER — Other Ambulatory Visit: Payer: Self-pay

## 2022-03-01 ENCOUNTER — Other Ambulatory Visit (HOSPITAL_BASED_OUTPATIENT_CLINIC_OR_DEPARTMENT_OTHER): Payer: Self-pay | Admitting: Family

## 2022-03-01 MED ORDER — VALACYCLOVIR 500 MG TABLET
500.0000 mg | ORAL_TABLET | Freq: Three times a day (TID) | ORAL | 0 refills | Status: AC
Start: 2022-03-01 — End: 2022-03-08
  Filled 2022-03-01: qty 21, 7d supply, fill #0

## 2022-07-25 ENCOUNTER — Other Ambulatory Visit: Payer: Self-pay

## 2022-07-25 ENCOUNTER — Encounter (INDEPENDENT_AMBULATORY_CARE_PROVIDER_SITE_OTHER): Payer: Self-pay

## 2022-07-25 ENCOUNTER — Other Ambulatory Visit: Payer: 59 | Attending: NURSE PRACTITIONER, FAMILY | Admitting: NURSE PRACTITIONER, FAMILY

## 2022-07-25 ENCOUNTER — Ambulatory Visit (INDEPENDENT_AMBULATORY_CARE_PROVIDER_SITE_OTHER): Payer: 59

## 2022-07-25 VITALS — HR 59 | Temp 97.6°F | Resp 16 | Ht 70.0 in | Wt 258.0 lb

## 2022-07-25 DIAGNOSIS — N390 Urinary tract infection, site not specified: Secondary | ICD-10-CM | POA: Insufficient documentation

## 2022-07-25 DIAGNOSIS — I10 Essential (primary) hypertension: Secondary | ICD-10-CM

## 2022-07-25 DIAGNOSIS — R300 Dysuria: Secondary | ICD-10-CM

## 2022-07-25 DIAGNOSIS — Z3202 Encounter for pregnancy test, result negative: Secondary | ICD-10-CM

## 2022-07-25 LAB — POC URINALYSIS (RESULTS)
BILIRUBIN: NEGATIVE mg/dL
GLUCOSE: NEGATIVE mg/dL
KETONES: NEGATIVE mg/dL
NITRITE: NEGATIVE
PH: 6 (ref <8.0)
PROTEIN: NEGATIVE mg/dL
SPECIFIC GRAVITY: 1.02 (ref 1.005–1.030)
UROBILINOGEN: 0.2 mg/dL

## 2022-07-25 MED ORDER — LOSARTAN 50 MG-HYDROCHLOROTHIAZIDE 12.5 MG TABLET
1.0000 | ORAL_TABLET | Freq: Every day | ORAL | 3 refills | Status: DC
Start: 2022-07-25 — End: 2022-10-04
  Filled 2022-07-25: qty 30, 30d supply, fill #0
  Filled 2022-08-29: qty 30, 30d supply, fill #1
  Filled 2022-09-20: qty 30, 30d supply, fill #2
  Filled 2022-09-29: qty 90, 90d supply, fill #2

## 2022-07-25 MED ORDER — IBUPROFEN 800 MG TABLET
800.0000 mg | ORAL_TABLET | Freq: Three times a day (TID) | ORAL | 0 refills | Status: AC | PRN
Start: 2022-07-25 — End: 2022-08-24
  Filled 2022-07-25: qty 90, 30d supply, fill #0

## 2022-07-25 MED ORDER — AMOXICILLIN 875 MG-POTASSIUM CLAVULANATE 125 MG TABLET
1.0000 | ORAL_TABLET | Freq: Two times a day (BID) | ORAL | 0 refills | Status: AC
Start: 2022-07-25 — End: 2022-08-01
  Filled 2022-07-25: qty 14, 7d supply, fill #0
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
## 2022-07-27 LAB — URINE CULTURE: URINE CULTURE: 75000 — AB

## 2022-08-29 ENCOUNTER — Other Ambulatory Visit: Payer: Self-pay

## 2022-08-29 DIAGNOSIS — I10 Essential (primary) hypertension: Secondary | ICD-10-CM

## 2022-09-01 ENCOUNTER — Other Ambulatory Visit (INDEPENDENT_AMBULATORY_CARE_PROVIDER_SITE_OTHER): Payer: BC Managed Care – PPO

## 2022-09-01 ENCOUNTER — Other Ambulatory Visit: Payer: Self-pay

## 2022-09-01 ENCOUNTER — Ambulatory Visit (INDEPENDENT_AMBULATORY_CARE_PROVIDER_SITE_OTHER): Payer: BC Managed Care – PPO

## 2022-09-01 ENCOUNTER — Encounter (INDEPENDENT_AMBULATORY_CARE_PROVIDER_SITE_OTHER): Payer: Self-pay

## 2022-09-01 VITALS — BP 148/99 | HR 76 | Temp 98.0°F | Resp 16 | Ht 70.0 in | Wt 255.0 lb

## 2022-09-01 DIAGNOSIS — R58 Hemorrhage, not elsewhere classified: Secondary | ICD-10-CM

## 2022-09-01 DIAGNOSIS — S99922A Unspecified injury of left foot, initial encounter: Secondary | ICD-10-CM

## 2022-09-01 DIAGNOSIS — W19XXXA Unspecified fall, initial encounter: Secondary | ICD-10-CM

## 2022-09-01 DIAGNOSIS — S8012XA Contusion of left lower leg, initial encounter: Secondary | ICD-10-CM

## 2022-09-01 DIAGNOSIS — W108XXA Fall (on) (from) other stairs and steps, initial encounter: Secondary | ICD-10-CM

## 2022-09-01 DIAGNOSIS — S79922A Unspecified injury of left thigh, initial encounter: Secondary | ICD-10-CM

## 2022-09-01 DIAGNOSIS — S99912A Unspecified injury of left ankle, initial encounter: Secondary | ICD-10-CM

## 2022-09-01 MED ORDER — IBUPROFEN 800 MG TABLET
800.0000 mg | ORAL_TABLET | Freq: Three times a day (TID) | ORAL | 0 refills | Status: DC | PRN
Start: 2022-09-01 — End: 2022-10-04
  Filled 2022-09-01: qty 42, 14d supply, fill #0

## 2022-09-01 NOTE — Addendum Note (Signed)
Addended by: Samule Ohm on: 09/01/2022 06:30 PM     Modules accepted: Orders

## 2022-09-01 NOTE — Progress Notes (Signed)
History of Present Illness: Natasha Perez is a 51 y.o. female who presents to the Urgent Care today with chief complaint of    Chief Complaint              Left Leg Pain [place] 08/25/2022, pain in thigh and ankle           Pt presents to urgent care today with c/o fall down stairs on 08/25/22. The Pt states she slipped down some wet stairs, twisted her L leg and fell on her R side. The Pt states her L lateral thigh and L ankle is bruised and swollen. The Pt states she felt a pins and needles sensation through her L leg but has been able to work on it all week.             Functional Health Screening:   Patient is under 18: No  Have you had a recent unexplained weight loss or gain?: No  Because we are aware of abuse and domestic violence today, we ask all patients: Are you being hurt, hit, or frightened by anyone at your home or in your life?: No  Do you have any basic needs within your home that are not being met? (such as Food, Shelter, Games developer, Transportation): No  Patient is under 18 and therefore has no Advance Directives: No  Patient has: No Advance  Patient has Advance Directive: No  Patient offered: Refused Packet  Screening unable to be completed: No         I reviewed and confirmed the patient's past medical history taken by the nurse or medical assistant with the addition of the following:    Past Medical History:    Past Medical History:   Diagnosis Date    Cancer (CMS [place])     melonoma    HTN (hypertension) 02/16/2021    Infertility management 08/12/2013    Plantar fasciitis        Past Surgical History:    Past Surgical History:   Procedure Laterality Date    HX BREAST BIOPSY Right     needle bx.  age  71    benign    HX MYOMECTOMY  2005    HX OTHER      3 right leg surgeries for melanoma    HX REFRACTIVE SURGERY  2000       Allergies:  Allergies   Allergen Reactions    Iodine And Iodide Containing Products Itching, Swelling and Hives/ Urticaria    Sulfa (Sulfonamides) Nausea/ Vomiting      Medications:    Current Outpatient Medications   Medication Sig    hydrocortisone 2.5 % Ointment Apply topically Twice daily    Ibuprofen (MOTRIN) 800 mg Oral Tablet Take 1 Tablet (800 mg total) by mouth Three times a day as needed for Pain for up to 14 days    losartan-hydrochlorothiazide (HYZAAR) 50-12.5 mg Oral Tablet TAKE ONE TABLET BY MOUTH ONCE DAILY.    multivitamin Tab Take 1 Tablet by mouth Once a day    pimecrolimus (ELIDEL) 1 % Cream Apply topically Twice daily    potassium chloride (K-DUR) 10 mEq Oral Tab Sust.Rel. Particle/Crystal Take 1 Tablet (10 mEq total) by mouth Once a day    triamcinolone acetonide (ARISTOCORT A) 0.1 % Ointment Apply topically Twice daily     Social History:    Social History     Tobacco Use    Smoking status: Never    Smokeless tobacco: Never  Vaping Use    Vaping Use: Never used   Substance Use Topics    Alcohol use: No     Alcohol/week: 0.0 standard drinks of alcohol     Comment: rare    Drug use: No     Family History:  Family Medical History:       Problem Relation (Age of Onset)    Breast Cancer Maternal Aunt    Cancer Other    Diabetes Mother    Hypertension (High Blood Pressure) Mother    Lung Cancer Maternal Grandmother    Stroke Mother (59)            Review of Systems:    Musculoskeletal:  ecchymosis and swelling to L lateral thigh and L ankle  All other review of systems were negative    Physical Exam:  Vital signs:   Vitals:    09/01/22 1729   BP: (!) 148/99   Pulse: 76   Resp: 16   Temp: 36.7 C (98 F)   TempSrc: Tympanic   SpO2: 98%   Weight: 116 kg (255 lb)   Height: 1.778 m (5' 10")   BMI: 36.67       Body mass index is 36.59 kg/m. Facility age limit for growth %iles is 20 years.  No LMP recorded. (Menstrual status: Irregular cycle).    General:  Well appearing and No acute distress  Eyes:  Normal lids/lashes, normal conjunctiva  Pulmonary:  No respiratory distress  Skin:  warm/dry and no rash  Psychiatric:  Appropriate affect and behavior and Normal  speech  Neurologic:   Alert and oriented x 3  Musculoskeletal: ecchymosis down L leg, swelling in L ankle, good pulses    Data Reviewed:    Radiography:  Left Foot x-ray Reviewed by Samule Ohm Pa-C on 09/01/2022 at 18:29. No fracture   Radiography:  Left Ankle x-ray Reviewed by Samule Ohm Pa-C  on 09/01/2022 at 18:29. No fracture   Radiography:  Left Femur x-ray Reviewed by Samule Ohm Pa-C  on 09/01/2022 at 18:29. No fracture           Course: Condition at discharge: Good     Differential Diagnosis: Muscle Strain vs Evulsion Fracture vs Ankle Injury    Assessment:   Assessment/Plan   1. Fall    2. Soft tissue injury of ankle, left, initial encounter    3. Ecchymosis      Plan:    Orders Placed This Encounter    Left Foot x-ray    Left Ankle x-ray    Left Femur x-ray    Ibuprofen (MOTRIN) 800 mg Oral Tablet     Left Foot x-ray preformed in clinic. See Data Review for results.  Left Ankle x-ray preformed in clinic. See Data Review for results.  Left Femur x-ray preformed in clinic. See Data Review for results.  Educated on symptomatic treatment with OTC medications and remedies.  Recommended supportive care with RICE therapy.  Go to Emergency Department immediately for further work up if any concerning symptoms.  Plan was discussed and patient verbalized understanding. If symptoms are worsening or not improving the patient should return to the urgent care for further evaluation.    Joycelyn Man, [place] 09/01/2022, 17:36

## 2022-09-04 DIAGNOSIS — M7989 Other specified soft tissue disorders: Secondary | ICD-10-CM

## 2022-09-04 DIAGNOSIS — W19XXXA Unspecified fall, initial encounter: Secondary | ICD-10-CM

## 2022-09-07 DIAGNOSIS — M1712 Unilateral primary osteoarthritis, left knee: Secondary | ICD-10-CM

## 2022-09-07 DIAGNOSIS — M19072 Primary osteoarthritis, left ankle and foot: Secondary | ICD-10-CM

## 2022-09-20 ENCOUNTER — Other Ambulatory Visit: Payer: Self-pay

## 2022-09-29 ENCOUNTER — Other Ambulatory Visit: Payer: Self-pay

## 2022-09-29 DIAGNOSIS — I10 Essential (primary) hypertension: Secondary | ICD-10-CM

## 2022-10-04 ENCOUNTER — Other Ambulatory Visit: Payer: Self-pay

## 2022-10-04 ENCOUNTER — Encounter (HOSPITAL_BASED_OUTPATIENT_CLINIC_OR_DEPARTMENT_OTHER): Payer: Self-pay | Admitting: Family

## 2022-10-04 ENCOUNTER — Ambulatory Visit: Payer: BC Managed Care – PPO | Attending: Family | Admitting: Family

## 2022-10-04 VITALS — BP 130/84 | HR 81 | Temp 96.8°F | Ht 71.0 in | Wt 250.0 lb

## 2022-10-04 DIAGNOSIS — N946 Dysmenorrhea, unspecified: Secondary | ICD-10-CM

## 2022-10-04 DIAGNOSIS — B001 Herpesviral vesicular dermatitis: Secondary | ICD-10-CM

## 2022-10-04 DIAGNOSIS — Z7689 Persons encountering health services in other specified circumstances: Secondary | ICD-10-CM

## 2022-10-04 DIAGNOSIS — M62838 Other muscle spasm: Secondary | ICD-10-CM

## 2022-10-04 DIAGNOSIS — N3000 Acute cystitis without hematuria: Secondary | ICD-10-CM

## 2022-10-04 DIAGNOSIS — E559 Vitamin D deficiency, unspecified: Secondary | ICD-10-CM

## 2022-10-04 DIAGNOSIS — Z131 Encounter for screening for diabetes mellitus: Secondary | ICD-10-CM

## 2022-10-04 DIAGNOSIS — Z1231 Encounter for screening mammogram for malignant neoplasm of breast: Secondary | ICD-10-CM

## 2022-10-04 DIAGNOSIS — I10 Essential (primary) hypertension: Secondary | ICD-10-CM

## 2022-10-04 DIAGNOSIS — Z13220 Encounter for screening for lipoid disorders: Secondary | ICD-10-CM

## 2022-10-04 MED ORDER — SULFAMETHOXAZOLE 800 MG-TRIMETHOPRIM 160 MG TABLET
1.0000 | ORAL_TABLET | Freq: Two times a day (BID) | ORAL | 0 refills | Status: DC
Start: 2022-10-04 — End: 2022-10-04
  Filled 2022-10-04: qty 14, 7d supply, fill #0

## 2022-10-04 MED ORDER — SULFAMETHOXAZOLE 800 MG-TRIMETHOPRIM 160 MG TABLET
1.0000 | ORAL_TABLET | Freq: Two times a day (BID) | ORAL | 0 refills | Status: AC
Start: 2022-10-04 — End: 2022-10-11
  Filled 2022-10-04: qty 14, 7d supply, fill #0

## 2022-10-04 MED ORDER — CHOLECALCIFEROL (VITAMIN D3) 1,250 MCG (50,000 UNIT) CAPSULE
50000.0000 [IU] | ORAL_CAPSULE | ORAL | 0 refills | Status: DC
Start: 2022-10-04 — End: 2023-01-12
  Filled 2022-10-04: qty 12, 84d supply, fill #0

## 2022-10-04 MED ORDER — LOSARTAN 50 MG-HYDROCHLOROTHIAZIDE 12.5 MG TABLET
1.0000 | ORAL_TABLET | Freq: Every day | ORAL | 4 refills | Status: DC
Start: 2022-10-04 — End: 2024-01-05
  Filled 2022-10-04 – 2023-07-09 (×4): qty 90, 90d supply, fill #0
  Filled 2023-09-11: qty 90, 90d supply, fill #1

## 2022-10-04 MED ORDER — IBUPROFEN 800 MG TABLET
800.0000 mg | ORAL_TABLET | Freq: Three times a day (TID) | ORAL | 4 refills | Status: DC | PRN
Start: 2022-10-04 — End: 2023-09-11
  Filled 2022-10-04 – 2023-01-02 (×2): qty 90, 30d supply, fill #0

## 2022-10-04 MED ORDER — VALACYCLOVIR 1 GRAM TABLET
500.0000 mg | ORAL_TABLET | Freq: Three times a day (TID) | ORAL | 1 refills | Status: DC
Start: 2022-10-04 — End: 2022-10-04
  Filled 2022-10-04: qty 90, 60d supply, fill #0

## 2022-10-04 MED ORDER — CYCLOBENZAPRINE 10 MG TABLET
10.0000 mg | ORAL_TABLET | Freq: Three times a day (TID) | ORAL | 1 refills | Status: DC
Start: 2022-10-04 — End: 2024-01-05
  Filled 2022-10-04: qty 30, 10d supply, fill #0
  Filled 2023-01-02 (×2): qty 30, 10d supply, fill #1

## 2022-10-04 MED ORDER — VALACYCLOVIR 500 MG TABLET
500.0000 mg | ORAL_TABLET | Freq: Three times a day (TID) | ORAL | 0 refills | Status: DC
Start: 2022-10-04 — End: 2023-01-01
  Filled 2022-10-04: qty 90, 30d supply, fill #0

## 2022-10-04 NOTE — Progress Notes (Signed)
[facility]: Family Medicine Office Visit      Service date: 10/04/2022:   Birth date: 23-Apr-1971    Natasha Perez is a 52 y.o. female who presents to [facility] today with chief complaint of Uti's and Back Pain    HISTORY OF PRESENT ILLNESS   The patient presents for an evaluation of the following:   Patient is presenting today to establish care here at [facility].  She was previously seen at the [facility], she did not have continuity of care, therefore she wanted to reestablished.    She is medical history of hypertension that is treated with losartan-hydrochlorothiazide 50-12.5 mg.    She also has recurrent UTIs, most recently she had symptoms of UTI over the weekend that she started an old prescription of Bactrim that she had from several years ago.  She is having improvement of her symptoms, but continues to have some cloudy urine and frequency.  She went to urgent Care, but there was a long wait, and she did not stay, therefore no urinalysis or culture was completed on this UTI.  She also had a UTI at the end of October which grew E coli    ROS     Review of Systems   Constitutional:  Negative for chills, fatigue and fever.   HENT:  Negative for congestion, ear pain, facial swelling, postnasal drip, sinus pressure and sinus pain.    Eyes:  Negative for visual disturbance.   Respiratory:  Negative for cough, chest tightness, shortness of breath and wheezing.    Cardiovascular:  Negative for chest pain, palpitations and leg swelling.   Gastrointestinal:  Negative for abdominal distention, abdominal pain, constipation, diarrhea, nausea and vomiting.   Genitourinary:  Positive for dysuria, frequency and urgency. Negative for difficulty urinating, flank pain and hematuria.   Musculoskeletal:  Positive for arthralgias and back pain. Negative for joint swelling, myalgias and neck pain.   Skin:  Negative for rash.   Neurological:  Negative for dizziness, weakness  and headaches.   Psychiatric/Behavioral:  Negative for agitation, sleep disturbance and suicidal ideas. The patient is not nervous/anxious.        MEDICAL HISTORY     Past Medical History:   Diagnosis Date    Cancer (CMS [place])     melonoma    HTN (hypertension) 02/16/2021    Infertility management 08/12/2013    Plantar fasciitis          Social History     Tobacco Use    Smoking status: Never    Smokeless tobacco: Never   Vaping Use    Vaping Use: Never used   Substance Use Topics    Alcohol use: No     Alcohol/week: 0.0 standard drinks of alcohol     Comment: rare    Drug use: No     Allergies   Allergen Reactions    Iodine And Iodide Containing Products Itching, Swelling and Hives/ Urticaria    Sulfa (Sulfonamides) Nausea/ Vomiting       Outpatient Medications Marked as Taking for the 10/04/22 encounter (Office Visit) with Derek Jack, APRN,FNP-BC   Medication Sig    cholecalciferol, vitamin D3, 1,250 mcg (50,000 unit) Oral Capsule Take 1 Capsule (50,000 Units total) by mouth Every 7 days    cyclobenzaprine (FLEXERIL) 10 mg Oral Tablet Take 1 Tablet (10 mg total) by mouth Three times a day    Ibuprofen (MOTRIN) 800 mg Oral Tablet Take  1 Tablet (800 mg total) by mouth Three times a day as needed for Pain    losartan-hydrochlorothiazide (HYZAAR) 50-12.5 mg Oral Tablet TAKE ONE TABLET BY MOUTH ONCE DAILY.    multivitamin Tab Take 1 Tablet by mouth Once a day    trimethoprim-sulfamethoxazole (BACTRIM DS) 160-'800mg'$  per tablet Take 1 Tablet (160 mg total) by mouth Twice daily for 7 days    valACYclovir (VALTREX) 500 mg Oral Tablet Take 1 Tablet (500 mg total) by mouth Three times a day     The patient's medications were reviewed and updated at the end of the clinic visit today.    Health Maintenance: Pending and Last Completed         Date Due Completion Date    Hepatitis B Vaccine (1 of 3 - 3-dose series) Never done ---    HIV Screening Never done ---    Shingles Vaccine (1 of 2) Never done ---    Covid-19  Vaccine (4 - 2023-24 season) 05/26/2022 02/17/2022    Depression Screening 10/05/2023 10/04/2022    Mammography 02/18/2024 02/17/2022    Pap smear 01/06/2026 01/06/2021    Colonoscopy 04/26/2026 04/26/2016    Adult Tdap-Td (2 - Td or Tdap) 02/15/2030 02/16/2020            Immunization History   Administered Date(s) Administered    Covid-19 Vaccine,Pfizer Bivalent,54mg/0.3ml,12 yrs+ 02/17/2022    Covid-19 Vaccine,Pfizer-BioNTech,Purple Top,1110yr 06/04/2020, 06/25/2020    Flu Family 07/14/2013    Flublok Influenza Vaccine, 1833yr10/10/2021    Influenza Vaccine, 6 month-adult 07/14/2013, 07/14/2014, 06/27/2017, 06/26/2019, 07/28/2020, 07/13/2021    Tetanus,Diptheria,Pertussis(BOOSTRIX) 02/16/2020       OBJECTIVE   BP 130/84   Pulse 81   Temp 36 C (96.8 F)   Ht 1.803 m ('5\' 11"'$ )   Wt 113 kg (250 lb)   LMP 08/31/2022 (Exact Date)   SpO2 98%   BMI 34.87 kg/m     Patient's last menstrual period was 08/31/2022 (exact date).Body mass index is 34.87 kg/m.  Wt Readings from Last 3 Encounters:   10/04/22 113 kg (250 lb)   09/01/22 116 kg (255 lb)   07/25/22 117 kg (258 lb)     BP Readings from Last 3 Encounters:   10/04/22 130/84   09/01/22 (!) 148/99   07/13/21 130/80     Physical examination:  General: Well nourished appearing, no acute distress  Eyes: Pupils equal and round, conjunctivae and sclerae normal   ENT: Ear canals clear bilaterally, TM normal bilaterally, nasal mucosa without edema, oropharynx without erythema or exudates.  Neck:  No significant thyromegaly or palpable lymphadenopathy.  Cardiovascular: Regular rate and rhythm, no murmurs noted, Absent peripheral edema.   Pulmonary: Clear to auscultation bilaterally.   Abdomen: bowel sounds + throughout, no hepatosplenomegaly   Musculoskeletal: ROM intact. Strength equal bilaterally.   Neurological: Grossly normal.  Integumentary: warm, dry, absent lesions or rashes.   Psych: Pleasant and cooperative. Answers questions appropriately.     Laboratory  Studies/Data review:  Not Applicable to today's encounter     ASSESSMENT/PLAN       ICD-10-CM    1. Encounter to establish care  Z76.89       2. Acute cystitis without hematuria  N30.00 URINALYSIS, MACROSCOPIC AND MICROSCOPIC W/CULTURE REFLEX     trimethoprim-sulfamethoxazole (BACTRIM DS) 160-'800mg'$  per tablet     DISCONTINUED: trimethoprim-sulfamethoxazole (BACTRIM DS) 160-'800mg'$  per tablet      3. Encounter for screening mammogram for malignant neoplasm of breast  Z12.31 MAMMO  BILATERAL SCREENING-ADDL VIEWS/BREAST US AS REQ BY RAD      4. Vitamin D deficiency  E55.9 cholecalciferol, vitamin D3, 1,250 mcg (50,000 unit) Oral Capsule      5. HTN (hypertension)  I10 losartan-hydrochlorothiazide (HYZAAR) 50-12.5 mg Oral Tablet     BASIC METABOLIC PANEL, FASTING      6. Screening cholesterol level  Z13.220 Lipid Panel      7. Screening for diabetes mellitus (DM)  J57.0 BASIC METABOLIC PANEL, FASTING      8. 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 Muscle spasm  M62.838 cyclobenzaprine (FLEXERIL) 10 mg Oral Tablet          Depression screening is negative. PHQ 2 Total: 0     follow up as scheduled - see epic follow up section Plan of Care items discussed below as required    FOLLOW UP:   Return in about 1 year (around 10/05/2023) for wellness visit.    Derek Jack, APRN,FNP-BC 11:12 10/04/22  [facility], [facility]  Operated by [facility]  [address]  [place] [address]  Dept: [phone]  Dept Fax: [phone]    This document was generated using a voice recognition system and transcription. All documents are proofed as best as possible, but it may have misspelled words, incorrect words, or syntax and grammatical errors because of the imperfect nature of the system.

## 2022-10-05 ENCOUNTER — Other Ambulatory Visit: Payer: Self-pay

## 2022-10-12 ENCOUNTER — Encounter (HOSPITAL_BASED_OUTPATIENT_CLINIC_OR_DEPARTMENT_OTHER): Payer: Self-pay | Admitting: Family

## 2022-11-06 ENCOUNTER — Encounter (HOSPITAL_BASED_OUTPATIENT_CLINIC_OR_DEPARTMENT_OTHER): Payer: Self-pay | Admitting: Family

## 2022-11-06 ENCOUNTER — Other Ambulatory Visit: Payer: Self-pay

## 2022-11-06 ENCOUNTER — Other Ambulatory Visit (HOSPITAL_BASED_OUTPATIENT_CLINIC_OR_DEPARTMENT_OTHER): Payer: BC Managed Care – PPO | Admitting: Gynecology

## 2022-11-06 ENCOUNTER — Ambulatory Visit: Payer: BC Managed Care – PPO | Attending: Family | Admitting: Family

## 2022-11-06 VITALS — Temp 98.0°F | Ht 70.0 in | Wt 250.0 lb

## 2022-11-06 DIAGNOSIS — Z808 Family history of malignant neoplasm of other organs or systems: Secondary | ICD-10-CM

## 2022-11-06 DIAGNOSIS — Z1283 Encounter for screening for malignant neoplasm of skin: Secondary | ICD-10-CM

## 2022-11-06 DIAGNOSIS — L820 Inflamed seborrheic keratosis: Secondary | ICD-10-CM | POA: Insufficient documentation

## 2022-11-06 DIAGNOSIS — D489 Neoplasm of uncertain behavior, unspecified: Secondary | ICD-10-CM | POA: Insufficient documentation

## 2022-11-06 DIAGNOSIS — Z09 Encounter for follow-up examination after completed treatment for conditions other than malignant neoplasm: Secondary | ICD-10-CM | POA: Insufficient documentation

## 2022-11-06 DIAGNOSIS — I781 Nevus, non-neoplastic: Secondary | ICD-10-CM | POA: Insufficient documentation

## 2022-11-06 DIAGNOSIS — D485 Neoplasm of uncertain behavior of skin: Secondary | ICD-10-CM

## 2022-11-06 DIAGNOSIS — Z85820 Personal history of malignant melanoma of skin: Secondary | ICD-10-CM | POA: Insufficient documentation

## 2022-11-06 DIAGNOSIS — N3000 Acute cystitis without hematuria: Secondary | ICD-10-CM

## 2022-11-06 LAB — URINALYSIS, MICROSCOPIC
RBCS: 1 /hpf (ref <6.0)
WBCS: <1 /hpf (ref <11.0)

## 2022-11-06 LAB — URINALYSIS, MACROSCOPIC
BILIRUBIN: NEGATIVE mg/dL
BLOOD: NEGATIVE mg/dL
COLOR: NORMAL
GLUCOSE: NEGATIVE mg/dL
KETONES: NEGATIVE mg/dL
LEUKOCYTES: NEGATIVE WBCs/uL
NITRITE: NEGATIVE
PH: 6 (ref 5.0–8.0)
PROTEIN: NEGATIVE mg/dL
SPECIFIC GRAVITY: 1.013 (ref 1.005–1.030)
UROBILINOGEN: NEGATIVE mg/dL

## 2022-11-06 NOTE — Progress Notes (Addendum)
Dermatology Clinic, [facility]  [place] [address]  [phone]    Date:   11/06/2022  Name: Natasha Perez  Age: 52 y.o.    Chief complaint: Follow Up      HPI    Natasha Perez is a 52 y.o.    White  Other female presenting for a Follow Up. She notes the rash has improved. States the cream did not help. Saw an outside dermatologist a few months ago. Suggested she use cerave, which has improved the rash. Has not been wearing a mask at work, is using a face shield. Notes of a purple/red lesion on her left cheek. Denies any itching or bleeding  Would like laser treatment. States she has darker moles on her right face and shoulders. Notes of a crusty,dry lesion under her left breast. Denies any weight loss, fevers or chills. No other skin related concerns. Patient history of melanoma.       Review of Systems   Constitutional:  Negative for chills and fever.   Musculoskeletal:  Negative for back pain.   Skin:  Positive for itching and rash.   Psychiatric/Behavioral:  Negative for depression and suicidal ideas.        Current Medications  Outpatient Encounter Medications as of 11/06/2022   Medication Sig Dispense Refill    cholecalciferol, vitamin D3, 1,250 mcg (50,000 unit) Oral Capsule Take 1 Capsule (50,000 Units total) by mouth Every 7 days 12 Capsule 0    cyclobenzaprine (FLEXERIL) 10 mg Oral Tablet Take 1 Tablet (10 mg total) by mouth Three times a day 30 Tablet 1    Ibuprofen (MOTRIN) 800 mg Oral Tablet Take 1 Tablet (800 mg total) by mouth Three times a day as needed for Pain 90 Tablet 4    losartan-hydrochlorothiazide (HYZAAR) 50-12.5 mg Oral Tablet TAKE ONE TABLET BY MOUTH ONCE DAILY. 90 Tablet 4    multivitamin Tab Take 1 Tablet by mouth Once a day  0    [EXPIRED] trimethoprim-sulfamethoxazole (BACTRIM DS) 160-837m per tablet Take 1 Tablet (160 mg total) by mouth Twice daily for 7 days 14 Tablet 0    valACYclovir (VALTREX) 500 mg Oral Tablet Take  1 Tablet (500 mg total) by mouth Three times a day 90 Tablet 0    [DISCONTINUED] lisinopriL (PRINIVIL) 10 mg Oral Tablet Take 1 Tablet (10 mg total) by mouth Twice daily for 30 days 90 Tablet 3    [DISCONTINUED] losartan (COZAAR) 25 mg Oral Tablet Take 1 Tablet (25 mg total) by mouth Once a day 90 Tablet 1     No facility-administered encounter medications on file as of 11/06/2022.        Allergies   Allergen Reactions    Iodine And Iodide Containing Products Itching, Swelling and Hives/ Urticaria    Sulfa (Sulfonamides) Nausea/ Vomiting        Past Medical History:   Diagnosis Date    [place]    HTN (hypertension) 02/16/2021    Infertility management 08/12/2013    Plantar fasciitis             Patient Active Problem List    Diagnosis Date Noted    HTN (hypertension) 02/16/2021    Class 2 obesity with body mass index (BMI) of 35.0 to 35.9 in adult 02/16/2021    Infertility management 08/12/2013    History of ectopic pregnancy 08/12/2013    History of melanoma 05/27/2010  Fibroid 05/27/2010    Vitamin D deficiency 05/27/2010         Physical Exam  Vitals:    Temp:  [36.7 C (98 F)] 36.7 C (98 F) (02/12 0831)   Vitals:    11/06/22 0831   Temp: 36.7 C (98 F)   Weight: 113 kg (250 lb)   Height: 1.778 m (5\' 10" )          Physical Exam  Constitutional:       General: She is not in acute distress.     Appearance: Normal appearance.   HENT:      Head:     Eyes:      General: Lids are normal.   Skin:     General: Skin is warm and dry.      Coloration: Skin is not pale.          Neurological:      Mental Status: She is alert and oriented to person, place, and time. Mental status is at baseline.      Coordination: Coordination normal.   Psychiatric:         Mood and Affect: Mood normal.              Assessment and Plan  1. Rash Favor Allergic Contact dermatitis vs Atopic Dermatitis vs Dermatomyositis (see #1 on head and body diagram) - resolved  - improved with Cerave    2. Inflamed seborrheic  keratosis (#2, body)   - Benign nature discussed with patient.    - Patient states lesion is pruritic/bothersome and is requesting treatment; given the physical exam I agree that treatment is warranted.       3. Neoplasm of uncertain behavior  - will plan to biopsy 3/20     4. Telangiectasias   - will laser 3/20    The patient was educated on the importance of avoiding excessive sun exposure and wearing sunscreen daily SPF 30 or higher.  Advised patient to re-apply sunscreen every 2-3 hours.  Advised the patient to avoid going to and using tanning beds.  Advised to check skin routinely for any changes, especially any new moles or changes in existing moles.     RTC on 3/20    This visit was rendered independently without a supervising physician on site.     I am scribing for, and in the presence of, Chamberlain Steinborn, APRN,FNP-BC for services provided on 09/26/2022.  Miguel Aschoff, Scribe          I personally performed the services described in this documentation, as scribed  in my presence, and it is both accurate  and complete.    [employer], APRN,FNP-BC

## 2022-11-22 ENCOUNTER — Other Ambulatory Visit: Payer: Self-pay

## 2022-11-22 MED ORDER — ONDANSETRON 4 MG DISINTEGRATING TABLET
4.0000 mg | ORAL_TABLET | Freq: Three times a day (TID) | ORAL | 0 refills | Status: DC | PRN
Start: 2022-11-22 — End: 2024-02-06
  Filled 2022-11-22: qty 30, 10d supply, fill #0
  Filled 2022-11-22: qty 30, 19d supply, fill #0
  Filled 2022-11-22: qty 90, 30d supply, fill #0

## 2022-11-23 ENCOUNTER — Ambulatory Visit (HOSPITAL_BASED_OUTPATIENT_CLINIC_OR_DEPARTMENT_OTHER): Payer: BC Managed Care – PPO | Admitting: Family

## 2022-12-13 ENCOUNTER — Ambulatory Visit: Payer: BC Managed Care – PPO | Attending: Family | Admitting: Family

## 2022-12-13 ENCOUNTER — Encounter (HOSPITAL_BASED_OUTPATIENT_CLINIC_OR_DEPARTMENT_OTHER): Payer: Self-pay | Admitting: Family

## 2022-12-13 ENCOUNTER — Other Ambulatory Visit: Payer: Self-pay

## 2022-12-13 VITALS — Temp 96.8°F | Ht 70.0 in | Wt 260.1 lb

## 2022-12-13 DIAGNOSIS — D485 Neoplasm of uncertain behavior of skin: Secondary | ICD-10-CM | POA: Insufficient documentation

## 2022-12-13 DIAGNOSIS — I781 Nevus, non-neoplastic: Secondary | ICD-10-CM

## 2022-12-13 MED ORDER — LIDOCAINE 1 %-EPINEPHRINE 1:100,000 INJECTION SOLUTION
2.5000 mL | INTRAMUSCULAR | Status: AC
Start: 2022-12-13 — End: 2022-12-13
  Administered 2022-12-13: 25 mg via INTRADERMAL

## 2022-12-14 ENCOUNTER — Encounter (HOSPITAL_BASED_OUTPATIENT_CLINIC_OR_DEPARTMENT_OTHER): Payer: Self-pay | Admitting: Family

## 2022-12-14 ENCOUNTER — Other Ambulatory Visit: Payer: Self-pay

## 2022-12-14 MED ORDER — ONDANSETRON 4 MG DISINTEGRATING TABLET
4.0000 mg | ORAL_TABLET | Freq: Three times a day (TID) | ORAL | 0 refills | Status: AC | PRN
Start: 2022-12-14 — End: ?
  Filled 2022-12-14: qty 30, 10d supply, fill #0

## 2022-12-14 NOTE — Progress Notes (Addendum)
Dermatology Clinic, [facility]  [address]  [place] [address]  [phone]    Date:   12/13/2022  Name: Natasha Perez  Age: 52 y.o.    Chief complaint: Skin Lesions      HPI    Natasha Perez is a 52 y.o.    White  Other female presenting for a Skin Lesions. Patient notes a lesion on her left cheek that was new in the last years. Was last in clinic in Feb. 2024 and declined biopsy at that time. Here for biopsy. Also would like laser treatment for the purple/red lesion bilateral cheeks. No other skin related concerns.      Review of Systems   Constitutional:  Negative for chills and fever.   Skin:  Negative for itching and rash.       Current Medications  Outpatient Encounter Medications as of 12/13/2022   Medication Sig Dispense Refill    cholecalciferol, vitamin D3, 1,250 mcg (50,000 unit) Oral Capsule Take 1 Capsule (50,000 Units total) by mouth Every 7 days 12 Capsule 0    cyclobenzaprine (FLEXERIL) 10 mg Oral Tablet Take 1 Tablet (10 mg total) by mouth Three times a day 30 Tablet 1    Ibuprofen (MOTRIN) 800 mg Oral Tablet Take 1 Tablet (800 mg total) by mouth Three times a day as needed for Pain 90 Tablet 4    losartan-hydrochlorothiazide (HYZAAR) 50-12.5 mg Oral Tablet TAKE ONE TABLET BY MOUTH ONCE DAILY. 90 Tablet 4    multivitamin Tab Take 1 Tablet by mouth Once a day  0    ondansetron (ZOFRAN ODT) 4 mg Oral Tablet, Rapid Dissolve Take 1 tablet by mouth every 8 hours as needed for nausea 90 Tablet 0    valACYclovir (VALTREX) 500 mg Oral Tablet Take 1 Tablet (500 mg total) by mouth Three times a day 90 Tablet 0    [DISCONTINUED] lisinopriL (PRINIVIL) 10 mg Oral Tablet Take 1 Tablet (10 mg total) by mouth Twice daily for 30 days 90 Tablet 3    [DISCONTINUED] losartan (COZAAR) 25 mg Oral Tablet Take 1 Tablet (25 mg total) by mouth Once a day 90 Tablet 1     Facility-Administered Encounter Medications as of 12/13/2022   Medication Dose Route Frequency Provider  Last Rate Last Admin    [COMPLETED] lidocaine 1%-EPINEPHrine 1:100,000 injection  2.5 mL Intradermal Now Kaysa Roulhac, APRN,FNP-BC   25 mg at 12/13/22 1538        Allergies   Allergen Reactions    Iodine And Iodide Containing Products Itching, Swelling and Hives/ Urticaria    Sulfa (Sulfonamides) Nausea/ Vomiting        Past Medical History:   Diagnosis Date    Cancer (CMS HCC)     melonoma    HTN (hypertension) 02/16/2021    Infertility management 08/12/2013    Plantar fasciitis             Patient Active Problem List    Diagnosis Date Noted    HTN (hypertension) 02/16/2021    Class 2 obesity with body mass index (BMI) of 35.0 to 35.9 in adult 02/16/2021    Infertility management 08/12/2013    History of ectopic pregnancy 08/12/2013    History of melanoma 05/27/2010    Fibroid 05/27/2010    Vitamin D deficiency 05/27/2010         Physical Exam  Vitals:    Temp:  [36 C (96.8 F)] 36 C (96.8 F) (03/20 1428)  Vitals:    12/13/22 1428   Temp: 36 C (96.8 F)   TempSrc: Thermal Scan   Weight: 118 kg (260 lb 2.3 oz)   Height: 1.778 m (5\' 10" )          Physical Exam  Constitutional:       General: She is not in acute distress.     Appearance: Normal appearance.   HENT:      Head:     Eyes:      General: Lids are normal.   Skin:     General: Skin is warm and dry.      Coloration: Skin is not pale.          Neurological:      Mental Status: She is alert and oriented to person, place, and time. Mental status is at baseline.      Coordination: Coordination normal.   Psychiatric:         Mood and Affect: Mood normal.              Assessment and Plan  1. Neoplasm of uncertain behavior  (# 1 on head diagram) - new  11104  PUNCH BX OF SKIN code in procedures and charges   -Clinical photography taken  -Size: see diagram above  - Location of biopsy: left cheek  - Differential diagnosis: Dilated Pore vs BCC   - - TIME OUT: A time out was performed to confirm the correct patient, procedure, and site. Consent obtained, area  cleaned, and anesthetized with lidocaine. A 2 mm punch biopsy was performed, and 6-0 Nylon suture was used. There was minimal bleeding. Vaseline and bandage were placed over the wound and wound care instructions were given. Patient demonstrated understanding for the instructions. Patient was advised to return to the clinic in approximately 5 days for suture removal. I explained to the patient it would take approximately 2-3 weeks for the pathology results to be available and I will personally contact the patient to discuss the results.    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   Laser: PDL  Wavelength: 5.95 nm  Spot size: 7 mm  Fluence and number of pulses at each fluence: 7.0 J/cm2   number of pulses: 30  Pulse duration: 10 ms  Cooling: yes  Endpoint:      Immediate post-laser care: Yes Vaseline  Complications:  None  Post-op medication: None  Wound care instructions given: Yes    Patient charged $50 at time of visit. No other charges should be generated.     This is a shared visit with Dr. Prince Rome     Dr. Prince Rome performed punch biopsy    I spent 35 minutes with patient     Jamas Lav, APRN,FNP-BC  I personally saw and evaluated the patient. See mid-level's note for additional details. My findings/participation are pore vs BCC cheek.  Will punch bx    Janiqua Friscia L Javarion Douty, MD    TIME: 5 min

## 2022-12-15 ENCOUNTER — Other Ambulatory Visit: Payer: Self-pay

## 2022-12-18 ENCOUNTER — Ambulatory Visit: Payer: BC Managed Care – PPO

## 2022-12-18 ENCOUNTER — Ambulatory Visit (HOSPITAL_BASED_OUTPATIENT_CLINIC_OR_DEPARTMENT_OTHER): Payer: BC Managed Care – PPO

## 2022-12-18 ENCOUNTER — Other Ambulatory Visit: Payer: Self-pay

## 2022-12-18 DIAGNOSIS — D229 Melanocytic nevi, unspecified: Secondary | ICD-10-CM | POA: Insufficient documentation

## 2022-12-18 DIAGNOSIS — Z4802 Encounter for removal of sutures: Secondary | ICD-10-CM

## 2022-12-18 NOTE — Nursing Note (Signed)
Patient presents for suture removal. The wound is well healed without signs of infection.  The sutures are removed.  Steri strips applied per pt request. Nicolasa Ducking, LPN

## 2022-12-19 ENCOUNTER — Encounter (HOSPITAL_BASED_OUTPATIENT_CLINIC_OR_DEPARTMENT_OTHER): Payer: Self-pay | Admitting: Family

## 2022-12-22 ENCOUNTER — Other Ambulatory Visit (HOSPITAL_BASED_OUTPATIENT_CLINIC_OR_DEPARTMENT_OTHER): Payer: Self-pay | Admitting: NURSE PRACTITIONER

## 2022-12-22 DIAGNOSIS — C44329 Squamous cell carcinoma of skin of other parts of face: Secondary | ICD-10-CM

## 2022-12-22 LAB — SURGICAL PATHOLOGY SPECIMEN

## 2022-12-28 ENCOUNTER — Encounter (HOSPITAL_BASED_OUTPATIENT_CLINIC_OR_DEPARTMENT_OTHER): Payer: Self-pay | Admitting: Family

## 2023-01-01 ENCOUNTER — Ambulatory Visit (HOSPITAL_BASED_OUTPATIENT_CLINIC_OR_DEPARTMENT_OTHER): Payer: Self-pay | Admitting: MOHS-Micrographic Surgery

## 2023-01-01 ENCOUNTER — Other Ambulatory Visit: Payer: Self-pay

## 2023-01-01 ENCOUNTER — Encounter (HOSPITAL_BASED_OUTPATIENT_CLINIC_OR_DEPARTMENT_OTHER): Payer: Self-pay | Admitting: MOHS-Micrographic Surgery

## 2023-01-01 ENCOUNTER — Ambulatory Visit: Payer: BC Managed Care – PPO | Attending: MOHS-Micrographic Surgery | Admitting: MOHS-Micrographic Surgery

## 2023-01-01 DIAGNOSIS — Z792 Long term (current) use of antibiotics: Secondary | ICD-10-CM

## 2023-01-01 DIAGNOSIS — C44329 Squamous cell carcinoma of skin of other parts of face: Secondary | ICD-10-CM

## 2023-01-01 MED ORDER — VALACYCLOVIR 500 MG TABLET
500.0000 mg | ORAL_TABLET | Freq: Two times a day (BID) | ORAL | 0 refills | Status: DC
Start: 2023-01-01 — End: 2023-01-12
  Filled 2023-01-01: qty 10, 5d supply, fill #0

## 2023-01-01 MED ORDER — CEPHALEXIN 500 MG CAPSULE
500.0000 mg | ORAL_CAPSULE | Freq: Three times a day (TID) | ORAL | 0 refills | Status: DC
Start: 2023-01-01 — End: 2023-01-12
  Filled 2023-01-01: qty 42, 14d supply, fill #0

## 2023-01-01 MED ORDER — VALACYCLOVIR 500 MG TABLET
500.0000 mg | ORAL_TABLET | Freq: Two times a day (BID) | ORAL | 0 refills | Status: DC
Start: 2023-01-01 — End: 2023-01-01

## 2023-01-01 MED ORDER — CEPHALEXIN 500 MG CAPSULE
500.0000 mg | ORAL_CAPSULE | Freq: Three times a day (TID) | ORAL | 0 refills | Status: DC
Start: 2023-01-01 — End: 2023-01-01

## 2023-01-01 NOTE — Telephone Encounter (Signed)
1124- Called and cancelled scripts of Valtrex and Keflex from [facility] per Dr. Donnel Saxon request.    Mechele Claude, RN   01/01/2023 11:25

## 2023-01-01 NOTE — Progress Notes (Addendum)
[facility], [facility]  [address]  [place] [id number]  [facility]     Name: Natasha Perez MRN:  [id number]   Date: 01/01/2023 Age: [age] y.o.       MOHS MICROGRAPHIC SURGERY/HISTORY AND PHYSICAL   TELEMEDICINE ENCOUNTER  Due to efforts to minimize exposure to COVID-19, a telemedicine encounter was conducted for this visit. The patient/family initiated a request for virtual check-in and the patient is established with this practice. I personally offered the service to the patient, and obtained verbal consent to provide this service.    TELEMEDICINE DOCUMENTATION:    Date of service: 01/01/2023   Patient Location: Home  Provider Location: Office    Patient/family aware of provider location:  Yes  Patient/family consent for telemedicine:  Yes  Encounter type: Telephone   Examination observed and performed by:  No additional physical exam performed given telephone encounter    Diagnosis and Location: LEFT CHEEK - squamous cell carcinoma, well differentiated  Pathology Report # [id number]   Underlying nerves or other vital structures intact: yes  Previous treatment(s) and date(s): no     Medical History  Smoking or chewing tobacco: no  Alcohol Use: no  Drug addiction:no  Bleeding tendency: no  Bleeding disorders or low platelets: no  Blood thinners: no    Cardiac and Circulatory History  Angina: no  CHF: no  Stents: no  Heart bypass: no  MI: no  Pacemaker: no  Implanted Defibrillator: no  Neurostimulator: no  Other Electronic Implants: no  Valvular Heart Disease: no  Prosthetic Heart Device: no  Arrhythmia: no  Endocarditis/Pericarditis: no  Peripheral Vascular Disease: no    Pulmonary Disease  Pulmonary Embolus, blood clots: no  Asthma: no  COPD: no  Any problems lying flat? 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  Immunosuppression: no  History of wound infections: no  Prosthetic hips, knees, shoulders, implants: no  Previous radiation treatment: no  Glaucoma: no  Hyperthyroidism: no  Diabetes: no  HTN: yes, well controlled on medications  Other medical conditions: no    COVID-19 History  Household contact to a COVID-positive person: no  Patient with symptoms of COVID-19: no    Patient is not a female of childbearing age.    Medications:  Current Outpatient Medications   Medication Sig    cephalexin (KEFLEX) 500 mg Oral Capsule Take 1 Capsule (500 mg total) by mouth Three times a day Start taking 2 days before surgery on a full stomach with full glass of water.    cholecalciferol, vitamin D3, 1,250 mcg (50,000 unit) Oral Capsule Take 1 Capsule (50,000 Units total) by mouth Every 7 days    cyclobenzaprine (FLEXERIL) 10 mg Oral Tablet Take 1 Tablet (10 mg total) by mouth Three times a day    Ibuprofen (MOTRIN) 800 mg Oral Tablet Take 1 Tablet (800 mg total) by mouth Three times a day as needed for Pain    losartan-hydrochlorothiazide (HYZAAR) 50-12.5 mg Oral Tablet TAKE ONE TABLET BY MOUTH ONCE DAILY.    multivitamin Tab Take 1 Tablet by mouth Once a day    ondansetron (ZOFRAN ODT) 4 mg Oral Tablet, Rapid Dissolve Take 1 tablet  by mouth every 8 hours as needed for nausea    ondansetron (ZOFRAN ODT) 4 mg Oral Tablet, Rapid Dissolve Dissolve 1 tablet on the tongue every 8 hours as needed for nausea    valACYclovir (VALTREX) 500 mg Oral Tablet Take 1 Tablet (500 mg total) by mouth Twice daily Start taking 1 day before surgery on a full stomach with full glass of water.      Photo:      Allergies:  Allergies   Allergen Reactions    Iodine And Iodide Containing Products Itching, Swelling and Hives/ Urticaria    Sulfa (Sulfonamides) Nausea/ Vomiting      Assessment/Plan    Mohs Pre-Op Progress Note  Natasha Perez is a  52 y.o. female  here for evaluation of:    LEFT CHEEK - squamous cell carcinoma, well differentiated    Discussed Mohs Micrographic Surgery in detail, including possible complications and alternative treatment options. Patient and physicians agree that Mohs Micrographic Surgery is the best therapeutic plan.    - Informed consent and photo consent obtained.   - Prescription(s) given for Keflex 500mg  PO TID for 2 weeks, starting 2 days before surgery  - Prescription(s) given for Valtrex 500mg  PO BID for 5 days, starting 1 day before surgery  - Advised pt to eat a hearty meal before the surgery.  - Advised pt to bring snacks and something to occupy their mind such as book, tablet during the waiting periods.  - Advised pt that they can bring one person to accompany them, but that this person would have to wait in the waiting room during the procedures.  - Mohs Appropriate Use Criteria (AUC):  7 (Lesion is appropriate for treatment by Mohs Surgery)    Please schedule patient for Mohs Surgery. Pt would like the repair performed by Plastic Surgery, so a Plastic Surgery referral was placed. At the same time, pt would like to have Mohs Surgery time reserved on 01/17/23 at 8:15am in case coordinating with Plastic Surgery takes a long time and she needs to travel to [place] a work commitment there. I informed the pt that most likely the Plastic Surgery repair will cause the Mohs date to be moved to a later date, and the pt expressed understanding. She says she will still want Plastic Surgery repair as long as the surgery dates are not too close to when [place] on [place]st.    A total of 21 minutes was spent on the call.    Cecilio AsperVlad Silverio Hagan, MD

## 2023-01-02 ENCOUNTER — Other Ambulatory Visit: Payer: Self-pay

## 2023-01-02 DIAGNOSIS — N946 Dysmenorrhea, unspecified: Secondary | ICD-10-CM

## 2023-01-02 DIAGNOSIS — I10 Essential (primary) hypertension: Secondary | ICD-10-CM

## 2023-01-02 DIAGNOSIS — M62838 Other muscle spasm: Secondary | ICD-10-CM

## 2023-01-03 ENCOUNTER — Other Ambulatory Visit: Payer: Self-pay

## 2023-01-04 ENCOUNTER — Encounter (HOSPITAL_BASED_OUTPATIENT_CLINIC_OR_DEPARTMENT_OTHER): Payer: Self-pay | Admitting: MOHS-Micrographic Surgery

## 2023-01-05 ENCOUNTER — Encounter (HOSPITAL_BASED_OUTPATIENT_CLINIC_OR_DEPARTMENT_OTHER): Payer: Self-pay | Admitting: Family

## 2023-01-10 ENCOUNTER — Ambulatory Visit (HOSPITAL_BASED_OUTPATIENT_CLINIC_OR_DEPARTMENT_OTHER): Payer: Self-pay | Admitting: MOHS-Micrographic Surgery

## 2023-01-12 ENCOUNTER — Encounter (INDEPENDENT_AMBULATORY_CARE_PROVIDER_SITE_OTHER): Payer: Self-pay | Admitting: PLASTIC AND RECONSTRUCTIVE SURGERY

## 2023-01-12 ENCOUNTER — Other Ambulatory Visit: Payer: Self-pay

## 2023-01-12 ENCOUNTER — Ambulatory Visit
Payer: BC Managed Care – PPO | Attending: PLASTIC AND RECONSTRUCTIVE SURGERY | Admitting: PLASTIC AND RECONSTRUCTIVE SURGERY

## 2023-01-12 VITALS — BP 131/94 | HR 76 | Temp 97.0°F | Ht 70.5 in | Wt 258.6 lb

## 2023-01-12 DIAGNOSIS — D0439 Carcinoma in situ of skin of other parts of face: Secondary | ICD-10-CM | POA: Insufficient documentation

## 2023-01-12 NOTE — Progress Notes (Signed)
PLASTIC SURGERY[facility]  [address]  [place] [address]  [phone]  PLASTIC SURGERY CONSULT NOTE      NAME: Natasha Perez  MRN: [id number]  DOB: 1971-08-22  PCP: Hollice Gong, APRN,FNP-BC      SUBJECTIVE     History of Present Illness  Natasha Perez is a 52 y.o. female  who is coming to the clinic today for evaluation for reconstruction of expected Moh's resection of SCC of the left cheek. She is tearful today. She is anxious about scarring and disfigurement on her face. She reports history of keloid scaring.       Prior history of skin cancer: Yes, melanoma in 20's   Family history of skin cancer:No   Personal or family history of bleeding or blood clots. No   Taking anticoagulants: No  Nicotine Use: No  Diabetes: No    She has past medical history of HTN, HLD, vitamin D, and melanoma. Surgical history includes mymectomy, right breast biopsy, and previous melanoma excision. She reports sulfa allergies.       Patient Active Problem List   Diagnosis Code    History of melanoma Z85.820    Fibroid D21.9    Vitamin D deficiency E55.9    Infertility management Z31.9    History of ectopic pregnancy Z87.59    HTN (hypertension) I10    Class 2 obesity with body mass index (BMI) of 35.0 to 35.9 in adult E66.9, Z68.35       Allergies  Allergies   Allergen Reactions    Iodine And Iodide Containing Products Itching, Swelling and Hives/ Urticaria    Sulfa (Sulfonamides) Nausea/ Vomiting        Past Medical History  Past Medical History:   Diagnosis Date    Cancer (CMS HCC)     melonoma    HTN (hypertension) 02/16/2021    Infertility management 08/12/2013    Plantar fasciitis              Surgical History  Past Surgical History:   Procedure Laterality Date    HX BREAST BIOPSY Right     needle bx.  age  31    benign    HX MYOMECTOMY  2005    HX OTHER      3 right leg surgeries for melanoma    HX REFRACTIVE SURGERY  2000             Social History  Social History      Socioeconomic History    Marital status: Single     Spouse name: Not on file    Number of children: 0    Years of education: 18+    Highest education level: Not on file   Occupational History    Occupation: Education administrator: [employer]   Tobacco Use    Smoking status: Never    Smokeless tobacco: Never   Vaping Use    Vaping status: Never Used   Substance and Sexual Activity    Alcohol use: No     Alcohol/week: 0.0 standard drinks of alcohol     Comment: rare    Drug use: No    Sexual activity: Yes     Partners: Male     Birth control/protection: None     Comment: Partner for 12 years   Other Topics Concern    Abuse/Domestic Violence No    Breast Self Exam Yes    Caffeine  Concern No    Calcium intake adequate Yes    Computer Use Not Asked    Drives Not Asked    Exercise Concern No     Comment: Tries to walk or bike.  Not getting as much exercise as she thinks she should.    Helmet Use Not Asked    Seat Belt Not Asked    Special Diet No     Comment: Only eating one meal a day now. Dinner is hit or miss.  Needs to eat more regularly she says.     Sunscreen used Not Asked    Uses Cane Not Asked    Uses walker Not Asked    Uses wheelchair Not Asked    Right hand dominant Not Asked    Left hand dominant Not Asked    Ambidextrous Not Asked    Shift Work Not Asked    Unusual Sleep-Wake Schedule Not Asked    Ability to Walk 1 Flight of Steps without SOB/CP Yes    Routine Exercise No    Ability to Walk 2 Flight of Steps without SOB/CP Yes    Unable to Ambulate No    Total Care No    Ability To Do Own ADL's Yes    Uses Walker No    Other Activity Level Yes    Uses Cane No   Social History Narrative    Lives in [place].  No children. 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 Brendolyn Patty is scuba diving.  Was an only child.  Got undergraduate degree at [employer] and a math degree at [employer].      Social Determinants of Health     Financial Resource Strain: Not on file    Transportation Needs: Not on file   Social Connections: Not on file   Intimate Partner Violence: Not on file   Housing Stability: Not on file         Family History  Patient's family history includes Breast Cancer in her maternal aunt; Cancer in an other family member; Diabetes in her mother; Hypertension (High Blood Pressure) in her mother; Lung Cancer in her maternal grandmother; Stroke (age of onset: 68) in her mother.      OBJECTIVE     BP (!) 131/94   Pulse 76   Temp 36.1 C (97 F)   Ht 1.791 m (5' 10.5")   Wt 117 kg (258 lb 9.6 oz)   SpO2 97%   BMI 36.58 kg/m        Body mass index is 36.58 kg/m.    Physical Exam  Constitutional:       Appearance: Normal appearance. She is obese.   HENT:      Head:     Cardiovascular:      Rate and Rhythm: Normal rate and regular rhythm.      Pulses: Normal pulses.   Pulmonary:      Effort: Pulmonary effort is normal. No respiratory distress.   Skin:     General: Skin is warm and dry.      Capillary Refill: Capillary refill takes less than 2 seconds.   Neurological:      General: No focal deficit present.      Mental Status: She is alert and oriented to person, place, and time.   Psychiatric:         Mood and Affect: Mood normal.         Behavior: Behavior normal.  Labs:  Lab Results   Component Value Date    WBC 8.6 11/01/2021        PATHOLOGY    Pathology reviewed from [facility].        ASSESSMENT & PLAN       ICD-10-CM    1. 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 female who has SCC of the left cheek who is scheduled for Moh's resection.    We discussed reconstructive options including primary closure, local tissue rearrangement, and skin grafting. Ultimately it is not clear which of these techniques will be employed until we see the final defect.      Specifically for her: She is moving to [facility] May 1st. Should like to for all of this to be done before this. She is  scheduled for Mohs surgery next Wednesday    Consent was signed today. She is refusing skin graft for closure.   PAT evaluation.  Will proceed as scheduled.     Patient was seen as a shared visit with Dr. Rich Reining who determined the assessment and plan. The patient was given the chance to ask all of their questions, and these were answered to their satisfaction.    August Luz, APRN 01/12/2023 09:54  [facility] Department of Plastic, Reconstructive and Hand Surgery    I saw and evaluated the patient with the APRN.  I personally examined the patient and confirmed pertinent history.  I agree with the evaluation and plan as dictated above.      Shary Lamos Ardeen Garland, MD 01/12/23 10:34

## 2023-01-15 ENCOUNTER — Telehealth (HOSPITAL_BASED_OUTPATIENT_CLINIC_OR_DEPARTMENT_OTHER): Payer: Self-pay | Admitting: MOHS-Micrographic Surgery

## 2023-01-15 NOTE — Telephone Encounter (Addendum)
-----   Message from Michelle HartlEdson Snowballaff sent at 01/15/2023  2:03 PM EDT -----  Dr Everitt Amber   This patient called in and has some concerns about her up coming procedure and has some questions she would like to ask her appt is on Wednesday 01/17/23 at 8:15 am she would like for someone to give her a call so she can have some peace of mind.    Thank you   Edson Snowball         [phone]- Attempted to call patient to answer questions regarding surgery, no answer. Left voicemail to call clinic back at earliest convenience.     Mechele Claude, RN   01/15/2023 17:01

## 2023-01-16 ENCOUNTER — Inpatient Hospital Stay (HOSPITAL_COMMUNITY)
Admission: RE | Admit: 2023-01-16 | Discharge: 2023-01-16 | Disposition: A | Discharge status: Home / Self Care | Payer: BC Managed Care – PPO | Source: Ambulatory Visit

## 2023-01-16 ENCOUNTER — Encounter (HOSPITAL_COMMUNITY): Payer: Self-pay | Admitting: PLASTIC AND RECONSTRUCTIVE SURGERY

## 2023-01-17 ENCOUNTER — Other Ambulatory Visit: Payer: Self-pay

## 2023-01-17 ENCOUNTER — Ambulatory Visit (INDEPENDENT_AMBULATORY_CARE_PROVIDER_SITE_OTHER): Payer: Self-pay | Admitting: PLASTIC SURGERY

## 2023-01-17 ENCOUNTER — Ambulatory Visit: Payer: BC Managed Care – PPO | Attending: MOHS-Micrographic Surgery | Admitting: MOHS-Micrographic Surgery

## 2023-01-17 ENCOUNTER — Encounter (HOSPITAL_BASED_OUTPATIENT_CLINIC_OR_DEPARTMENT_OTHER): Payer: Self-pay | Admitting: MOHS-Micrographic Surgery

## 2023-01-17 VITALS — BP 128/93 | HR 84 | Temp 97.4°F | Resp 20 | Wt 257.0 lb

## 2023-01-17 DIAGNOSIS — C44329 Squamous cell carcinoma of skin of other parts of face: Secondary | ICD-10-CM | POA: Insufficient documentation

## 2023-01-17 MED ORDER — LIDOCAINE 1%-EPI 1:100,000 BUFFERED W/8.4% SOD BICARB(3 ML)INJ SYRINGE
3.0000 mL | INJECTION | Freq: Once | INTRAMUSCULAR | Status: AC
Start: 2023-01-17 — End: 2023-01-17
  Administered 2023-01-17: 9 mL via INTRAMUSCULAR

## 2023-01-17 MED ORDER — LIDOCAINE 1%-EPI 1:100,000 BUFFERED W/8.4% SOD BICARB(3 ML)INJ SYRINGE
INJECTION | INTRAMUSCULAR | Status: AC
Start: 2023-01-17 — End: 2023-01-17
  Filled 2023-01-17: qty 15

## 2023-01-17 NOTE — Patient Instructions (Signed)
01/17/2023  Our office hours are Monday through Thursday - 7:00 a.m. to 4:30 p.m. If you have any questions or concerns, please do not hesitate to contact us at [phone].       CHANGE DRESSING DAILY BY FOLLOWING THE INSTRUCTIONS BELOW:     1. Remove the old dressing, shower 48 hours after surgery. We prefer you wait close to 48 hours to change your bandage. Shower with bandage off.     2. Use cotton swab to cleanse area with warm water. This may take several swabs.     3. If crusting starts and you can not remove it easily use warm soaks to soften the crust first for 15 minutes then follow dressing instructions.     4. Cut the telfa or nonstick dressing to fit the exact size of the stitches or wound.     5. Apply Vaseline to the telfa pad then place over area.     Note: Vaseline in a tube is preferred. If you do not have it in a tube and need to use Vaseline in a tub or container, please make sure of the following:  -Tub/container is new/unused (previously used containers can increase risk of infection)  -Use clean Qtip  to remove vaseline from container (do not use your fingers). Use a clean Qtip every time you insert it into the vaseline. This helps prevent cross contamination.    6. Cover the telfa with paper tape.     7. Bandage must be airtight to promote faster wound healing. If you cannot get it airtight, change it more often and reapply Vaseline.    8. If the wound site is near the eye use Polysporin or Vaseline, available over the counter at your pharmacy.     9. Keep head or extremity elevated to decrease swelling. Apply ice over your bandage 20 minutes out of every hour while you are awake and as much as possible in the first 24 hours after surgery. Use a sealed baggy over a towel or frozen bag of peas.     10. Tylenol as needed as directed for pain.     11. Bleeding is rare but if it should occur, lie down and apply firm pressure to the site. If your bandage is wet you must take it off first.  Direct pressure with a dry towel or gauze is applied for 15 minutes as noted by your watch. You are not to discontinue pressure to see if the bleeding has stopped until a total of 15 minutes has elapsed. If the bleeding is continuing despite your efforts, you may not be pushing hard enough or the pressure is not being exerted in the right area. Try adjusting and reapplying pressure for a second 15 minutes. Also, if you are on a blood thinner, you will have to apply pressure for 30 minutes. If bleeding continues, call the office at [phone] immediately. If it is after hours, please call [phone] and explain to the answering service that you need to speak to the dermatology person on- call. It may be necessary to go to the nearest emergency room if you are unable to come into the office     12. It is completely normal to have some swelling and redness about the wound site. Swelling will be worse 2 days after surgery. If your wound is near the eye area, the eyelids may swell. This will gradually disappear over the next few days to a week. Some drainage from the wound   will occur, and this may have an odor. This will usually stop after a few days. Please call for any questions or concerns.     13. Schedule an appointment to have your stitches removed in 7-14 days.     14. If you have dissolving sutures, you may stop bandaging in 7-10 days. Apply Vaseline with a cotton swab to suture line several times a day to keep suture line continually moist for an additional 3 days.     If you were told you did not need to return to our office you need to see your dermatologist in about 6 months for your normal skin check.     Supplies needed:   _____Vaseline (In a tube is preferred. If you do not have a tube, please refer to instruction number 5)  _____Non-stick dressing   _____Micropore paper tape   _____Q-tips     15. Take antibiotic if prescribed until gone.     16. Avoid any strenuous lifting, stretching straining or  bending over during first week post surgery.     17. Cigarette smoking constricts blood vessels and it is recommended that you do not smoke during your healing time post surgery.

## 2023-01-18 ENCOUNTER — Ambulatory Visit (HOSPITAL_BASED_OUTPATIENT_CLINIC_OR_DEPARTMENT_OTHER): Payer: Self-pay | Admitting: MOHS-Micrographic Surgery

## 2023-01-18 ENCOUNTER — Other Ambulatory Visit: Payer: Self-pay

## 2023-01-18 DIAGNOSIS — G8918 Other acute postprocedural pain: Secondary | ICD-10-CM

## 2023-01-18 MED ORDER — HYDROCODONE 5 MG-ACETAMINOPHEN 325 MG TABLET
1.0000 | ORAL_TABLET | Freq: Three times a day (TID) | ORAL | 0 refills | Status: DC | PRN
Start: 2023-01-18 — End: 2023-09-12
  Filled 2023-01-18: qty 10, 4d supply, fill #0

## 2023-01-18 NOTE — Addendum Note (Signed)
Addended by: Ok Edwards on: 01/18/2023 02:58 PM     Modules accepted: Orders

## 2023-01-18 NOTE — Telephone Encounter (Signed)
1610- Called to check in with patient post operatively. Patient denies any bleeding at this time. She states there is some swelling around her left eye, I discussed with her this is a normal finding. She rates her pain a 5/10 at this time and has taken Tylenol prn but it doesn't seem to help much. I discussed with her I would let Dr. Everitt Amber know, she voiced understanding and will call clinic with any further questions or concerns.    Mechele Claude, RN   01/18/2023 09:41

## 2023-01-18 NOTE — Telephone Encounter (Signed)
I called the pt, who said that she had pain overnight but it is getting better. She says it is worse when she talks or moves her face. I told her that I will send an rx for Norco 5-374m 1 tab q8h prn pain 7-10/10, 10 tabs, 0 refills, with caution to not take before driving or operating machinery and that it may make her more likely to fall. She expressed appreciation for the call.    Cecilio Asper MD, PhD  Mohs Micrographic Surgery  Department of Dermatology

## 2023-01-19 ENCOUNTER — Encounter (HOSPITAL_COMMUNITY): Admission: RE | Payer: Self-pay

## 2023-01-19 ENCOUNTER — Inpatient Hospital Stay (HOSPITAL_COMMUNITY): Admission: RE | Admit: 2023-01-19 | Payer: BC Managed Care – PPO | Admitting: PLASTIC AND RECONSTRUCTIVE SURGERY

## 2023-01-19 SURGERY — TRANSFER ADJACENT TISSUE HEAD/FACE/SCALP
Anesthesia: General | Laterality: Left

## 2023-01-22 ENCOUNTER — Ambulatory Visit (HOSPITAL_BASED_OUTPATIENT_CLINIC_OR_DEPARTMENT_OTHER): Payer: Self-pay | Admitting: MOHS-Micrographic Surgery

## 2023-01-22 ENCOUNTER — Ambulatory Visit: Payer: BC Managed Care – PPO | Attending: MOHS-Micrographic Surgery | Admitting: MOHS-Micrographic Surgery

## 2023-01-22 ENCOUNTER — Other Ambulatory Visit: Payer: Self-pay

## 2023-01-22 ENCOUNTER — Encounter (HOSPITAL_BASED_OUTPATIENT_CLINIC_OR_DEPARTMENT_OTHER): Payer: Self-pay | Admitting: MOHS-Micrographic Surgery

## 2023-01-22 DIAGNOSIS — C44329 Squamous cell carcinoma of skin of other parts of face: Secondary | ICD-10-CM | POA: Insufficient documentation

## 2023-01-22 DIAGNOSIS — Z4802 Encounter for removal of sutures: Secondary | ICD-10-CM

## 2023-01-22 DIAGNOSIS — Z09 Encounter for follow-up examination after completed treatment for conditions other than malignant neoplasm: Secondary | ICD-10-CM | POA: Insufficient documentation

## 2023-01-22 DIAGNOSIS — Z483 Aftercare following surgery for neoplasm: Secondary | ICD-10-CM

## 2023-01-22 DIAGNOSIS — Z85828 Personal history of other malignant neoplasm of skin: Secondary | ICD-10-CM

## 2023-01-24 NOTE — Progress Notes (Signed)
[facility]  [address]  [place] [address]  Operated by [facility]     Name: Natasha Perez MRN:  [id number]   Date: 01/17/2023 Age: 52 y.o.     Mohs Day of Surgery Checklist:    Allergies   Allergen Reactions    Iodine And Iodide Containing Products Itching, Swelling and Hives/ Urticaria    Sulfa (Sulfonamides) Nausea/ Vomiting       Current Outpatient Medications   Medication Sig Dispense Refill    cyclobenzaprine (FLEXERIL) 10 mg Oral Tablet Take 1 Tablet (10 mg total) by mouth Three times a day (Patient taking differently: Take 1 Tablet (10 mg total) by mouth Every 8 hours as needed) 30 Tablet 1    HYDROcodone-acetaminophen (NORCO) 5-325 mg Oral Tablet Take 1 Tablet by mouth Every 8 hours as needed for Pain (7-10/10. Don't drive or operate machinery after taking medication. May make you more likely to fall.) 10 Tablet 0    Ibuprofen (MOTRIN) 800 mg Oral Tablet Take 1 Tablet (800 mg total) by mouth Three times a day as needed for Pain 90 Tablet 4    losartan-hydrochlorothiazide (HYZAAR) 50-12.5 mg Oral Tablet TAKE ONE TABLET BY MOUTH ONCE DAILY. 90 Tablet 4    ondansetron (ZOFRAN ODT) 4 mg Oral Tablet, Rapid Dissolve Take 1 tablet by mouth every 8 hours as needed for nausea 90 Tablet 0    ondansetron (ZOFRAN ODT) 4 mg Oral Tablet, Rapid Dissolve Dissolve 1 tablet on the tongue every 8 hours as needed for nausea 90 Tablet 0     No current facility-administered medications for this visit.       Brief Physical Exam:  BP (!) 128/93   Pulse 84   Temp 36.3 C (97.4 F) (Oral)   Resp 20   Wt 117 kg (257 lb)   LMP 01/04/2023   SpO2 96%   BMI 36.35 kg/m       Lymph Nodes: no lymph nodes palpable in cervical neck and supraclavicular areas  Skin: scar at biopsy site on LEFT CHEEK     MOHS OPERATIVE REPORT    Testing Laboratory:  [facility]  1st Floor Mohs Surgery Lab  [facility] [address]  [place], [place]  [address]    Date(s):  01/17/2023    Patient:  Natasha Perez                                                  UH#: [id number]                 Faculty Surgeon: Cecilio Asper, M.D., Ph.D.    _____________________________________________________________________    Location: LEFT CHEEK     Preoperative Diagnosis:  SQUAMOUS CELL CARCINOMA, WELL-DIFFERENTIATED     Postoperative Diagnosis: SQUAMOUS CELL CARCINOMA, WELL-DIFFERENTIATED     Procedure: Mohs Micrographic Surgery     Pretreatment Size:  0.9 cm x 0.6 cm       Stage: 1  Blocks: 1  Tumor seen: no  Depth of Invasion: no tumor seen  Scar: no  PNI: no    Final Wound Size: 0.9 cm x 1.0 cm    Anesthesia: 6.0 ml lidocaine 1% w/epinephrine 1:100,000        Estimated Blood Loss:  minimal  Complications: none     Mohs Appropriate Use Criteria (AUC):  7 (Lesion is appropriate for treatment by Mohs Surgery)  _____________________________________________________________________    After a full discussion with the patient, who reviewed the options for further management, an informed consent for surgery was obtained.  The patient identified the tumor site, the tumor was marked with a sterile surgical marking pen and the location was reconfirmed with the patient.     The patient was brought to the operating room and prepped and draped in the usual manner and a time out was performed.  The tissue surrounding the tumor was infiltrated with local anesthesia and a curette was used to remove the gross tumor.  The Mohs Stage I tissue specimen layer was resected and hemostasis was achieved through the use of the electrocautery.  The tissue specimen and corresponding skin adjacent to the defect were marked with hashes using a scalpel. The hashes were colored with ink of different colors as shown in the Mohs Map included herein. The tissue was cut to display en-face margins on slides that were subsequently stained with hematoxylin and eosin and examined under the microscope. The Mohs  Surgeon, Dr. Cecilio Asper MD PhD, personally examined the histopathology slides and performed the Mohs surgery, acting as both the surgeon and the pathologist providing the histologic evaluation of the specimen. A dressing was applied and this sequence was repeated until surgery was completed and negative margins were obtained.  Following completion of the Mohs micrographic surgery a full discussion with the patient addressed all of the alternative options for further wound management.  This included the option of no further treatment.     Mohs Procedure Progress Note:    Natasha Perez is a 52 y.o. female here for scheduled Mohs Micrographic Surgery.  Consent reviewed. Sterile prep with alcohol. Anesthesia with 1% lidocaine w/epinephrine 1:100,000. Negative margins obtained using Mohs Micrographic Surgery techniques in 1 stage. Discussed reconstructive options. Patient and physicians agree that primary closure is the best reconstructive option.    Primary Closure:  Length of Closure: 2.4 cm  Sutures Used: 5.0 Monocryl deep dermal and 5.0 Vicryl Rapide epicuticular  Closure Type: Primary Closure   Estimated Blood Loss: minimal  Anesthesia: 6.0 ml lidocaine 1% w/epinephrine 1:100,000  Complications: none  Patient Information Given: yes  The defect was moderately undermined in the subcutaneous plane to reduce wound closure tension and allow for easy wound edge eversion. Hemostasis was obtained with the electro-cautery and vascular suture where needed. Interrupted, inverted buried sutures were placed, using the material mentioned above, to close the superficial fascia and skin of the wound. Interrupted and running simple cutaneous sutures were used to further approximate and evert the wound edges. The final length of the repair is listed in the summary above.  A firm pressure dressing was applied over petrolatum.  Verbal postoperative wound care instructions were given and a wound care hand out was provided. The  patient was also told to call us at anytime for signs of wound infection or any other concerns they might have regarding the surgery or the healing process. The patient tolerated the procedure well.      Instructions for dressing changes and management of postoperative bleeding were discussed. Technique for holding pressure for post-operative bleeding was demonstrated.    Verbal postoperative wound care instructions were given and a wound care hand out was provided. The patient was asked to follow up in 1 week for a wound check/suture removal. The patient was  also told to call us at anytime for signs of wound infection or any other concerns they might have regarding the surgery or the healing process. The patient tolerated the procedure well.    I am scribing for, and in the presence of, Dr. Everitt Amber for services provided on 01/17/2023.    Mechele Claude, RN    I, Dr. Cecilio Asper, personally performed and/or ordered the services described in this documentation, as scribed above by Mechele Claude, RN in my presence, and it is both accurate and complete.     Cecilio Asper MD, PhD  Mohs Micrographic Surgery  Department of Dermatology

## 2023-01-26 ENCOUNTER — Ambulatory Visit (INDEPENDENT_AMBULATORY_CARE_PROVIDER_SITE_OTHER): Payer: Self-pay | Admitting: PLASTIC AND RECONSTRUCTIVE SURGERY

## 2023-02-01 ENCOUNTER — Other Ambulatory Visit: Payer: Self-pay

## 2023-02-01 ENCOUNTER — Encounter (HOSPITAL_BASED_OUTPATIENT_CLINIC_OR_DEPARTMENT_OTHER): Payer: Self-pay | Admitting: MOHS-Micrographic Surgery

## 2023-02-02 ENCOUNTER — Other Ambulatory Visit: Payer: Self-pay

## 2023-02-05 ENCOUNTER — Other Ambulatory Visit: Payer: Self-pay

## 2023-02-06 ENCOUNTER — Encounter (HOSPITAL_BASED_OUTPATIENT_CLINIC_OR_DEPARTMENT_OTHER): Payer: Self-pay | Admitting: MOHS-Micrographic Surgery

## 2023-02-06 LAB — DERMATOLOGIC SURGERY LABORATORY SPECIMEN: Final Diagnosis: NEGATIVE

## 2023-02-06 NOTE — Progress Notes (Signed)
MOHS FOLLOW UP VISIT     Visit Type: Outpatient Dermatology Clinic Visit    Chief Complaint: MOHS Surgery follow up: S/P Mohs Micrographic Surgery for:    LEFT CHEEK - squamous cell carcinoma, well differentiated     HPI  Natasha Perez is a 52 y.o. female here for follow up for the procedure listed above.  Denies issues with pain or bleeding.  No other concerns at this time.    Histories  Past Medical History:   Diagnosis Date    Cancer (CMS HCC)     melonoma    HTN (hypertension) 02/16/2021    Infertility management 08/12/2013    Plantar fasciitis          Past Surgical History:   Procedure Laterality Date    HX BREAST BIOPSY Right     needle bx.  age  72    benign    HX MYOMECTOMY  2005    HX OTHER      3 right leg surgeries for melanoma, local anesthesia    HX REFRACTIVE SURGERY  2000    local anesthesia         Family Medical History:       Problem Relation (Age of Onset)    Breast Cancer Maternal Aunt    Cancer Other    Diabetes Mother    Hypertension (High Blood Pressure) Mother    Lung Cancer Maternal Grandmother    Stroke Mother (63)            Social History     Socioeconomic History    Marital status: Single    Number of children: 0    Years of education: 18+   Occupational History    Occupation: Education administrator: [employer]   Tobacco Use    Smoking status: Never    Smokeless tobacco: Never   Vaping Use    Vaping status: Never Used   Substance and Sexual Activity    Alcohol use: No     Alcohol/week: 0.0 standard drinks of alcohol     Comment: rare    Drug use: No    Sexual activity: Yes     Partners: Male     Birth control/protection: None     Comment: Partner for 12 years   Other Topics Concern    Abuse/Domestic Violence No    Breast Self Exam Yes    Caffeine Concern No    Calcium intake adequate Yes    Exercise Concern No     Comment: Tries to walk or bike.  Not getting as much exercise as she thinks she should.    Special Diet No     Comment: Only eating one meal a day now. Dinner is  hit or miss.  Needs to eat more regularly she says.     Ability to Walk 1 Flight of Steps without SOB/CP Yes    Routine Exercise Yes     Comment: walks 2 miles per day    Ability to Walk 2 Flight of Steps without SOB/CP Yes    Unable to Ambulate No    Total Care No    Ability To Do Own ADL's Yes    Uses Walker No    Other Activity Level Yes     Comment: housework, shopping and laundry    Uses Cane No   Social History Narrative    Lives in [place].  No children. 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 Brendolyn Patty is scuba diving.  Was an only child.  Got undergraduate degree at [employer] and a math degree at [employer].        Allergies  Allergies   Allergen Reactions    Iodine And Iodide Containing Products Itching, Swelling and Hives/ Urticaria    Sulfa (Sulfonamides) Nausea/ Vomiting       Medications  Current Outpatient Medications   Medication Sig Dispense Refill    cyclobenzaprine (FLEXERIL) 10 mg Oral Tablet Take 1 Tablet (10 mg total) by mouth Three times a day (Patient taking differently: Take 1 Tablet (10 mg total) by mouth Every 8 hours as needed) 30 Tablet 1    HYDROcodone-acetaminophen (NORCO) 5-325 mg Oral Tablet Take 1 Tablet by mouth Every 8 hours as needed for Pain (7-10/10. Don't drive or operate machinery after taking medication. May make you more likely to fall.) 10 Tablet 0    Ibuprofen (MOTRIN) 800 mg Oral Tablet Take 1 Tablet (800 mg total) by mouth Three times a day as needed for Pain 90 Tablet 4    losartan-hydrochlorothiazide (HYZAAR) 50-12.5 mg Oral Tablet TAKE ONE TABLET BY MOUTH ONCE DAILY. 90 Tablet 4    ondansetron (ZOFRAN ODT) 4 mg Oral Tablet, Rapid Dissolve Take 1 tablet by mouth every 8 hours as needed for nausea 90 Tablet 0    ondansetron (ZOFRAN ODT) 4 mg Oral Tablet, Rapid Dissolve Dissolve 1 tablet on the tongue every 8 hours as needed for nausea 90 Tablet 0     No current facility-administered medications for this visit.     Physical  Exam  (-)=Negative,(+)=Positive  LMP 01/04/2023        General: NAD  Hema/Lymph:negative cervical, submandibular, supraclavicular lymph nodes  Skin: well healing scar at surgery site    Assessment/Plan   Natasha Perez is a 52 y.o. female S/P Mohs Micrographic Surgery for:    LEFT CHEEK - squamous cell carcinoma, well differentiated      - Well healing surgical site  - No erythema, swelling, drainage; no signs of infection  - Sutures removed  - Photo taken  - Continue copious Vaseline x2 weeks  - Start ScarAway silicone gel sheet after 2weeks  - Keep wound/scar out of sun; frequent sunscreen while exposed to sun  - Discharged from Mohs service  - F/U general dermatologist in 6 months    Cecilio Asper MD, PhD  Mohs Micrographic Surgery  Department of Dermatology

## 2023-02-21 ENCOUNTER — Encounter (HOSPITAL_BASED_OUTPATIENT_CLINIC_OR_DEPARTMENT_OTHER): Payer: Self-pay | Admitting: Family

## 2023-02-23 ENCOUNTER — Ambulatory Visit (HOSPITAL_BASED_OUTPATIENT_CLINIC_OR_DEPARTMENT_OTHER): Payer: BC Managed Care – PPO

## 2023-03-30 ENCOUNTER — Other Ambulatory Visit: Payer: Self-pay

## 2023-04-12 ENCOUNTER — Encounter (HOSPITAL_BASED_OUTPATIENT_CLINIC_OR_DEPARTMENT_OTHER): Payer: Self-pay | Admitting: Family

## 2023-05-23 ENCOUNTER — Other Ambulatory Visit: Payer: Self-pay

## 2023-05-23 ENCOUNTER — Ambulatory Visit (HOSPITAL_BASED_OUTPATIENT_CLINIC_OR_DEPARTMENT_OTHER): Payer: BC Managed Care – PPO

## 2023-05-23 ENCOUNTER — Ambulatory Visit: Payer: Self-pay | Attending: Family | Admitting: Family

## 2023-05-23 ENCOUNTER — Ambulatory Visit
Admission: RE | Admit: 2023-05-23 | Discharge: 2023-05-23 | Disposition: A | Discharge status: Home / Self Care | Payer: Self-pay | Source: Ambulatory Visit | Attending: Family | Admitting: Family

## 2023-05-23 ENCOUNTER — Ambulatory Visit (HOSPITAL_BASED_OUTPATIENT_CLINIC_OR_DEPARTMENT_OTHER): Payer: BC Managed Care – PPO | Admitting: Family

## 2023-05-23 ENCOUNTER — Encounter (HOSPITAL_BASED_OUTPATIENT_CLINIC_OR_DEPARTMENT_OTHER): Payer: Self-pay

## 2023-05-23 VITALS — Temp 97.2°F | Ht 70.0 in | Wt 241.6 lb

## 2023-05-23 DIAGNOSIS — L570 Actinic keratosis: Secondary | ICD-10-CM | POA: Insufficient documentation

## 2023-05-23 DIAGNOSIS — Z08 Encounter for follow-up examination after completed treatment for malignant neoplasm: Secondary | ICD-10-CM

## 2023-05-23 DIAGNOSIS — Z1283 Encounter for screening for malignant neoplasm of skin: Secondary | ICD-10-CM | POA: Insufficient documentation

## 2023-05-23 DIAGNOSIS — Z85820 Personal history of malignant melanoma of skin: Secondary | ICD-10-CM

## 2023-05-23 DIAGNOSIS — Z85828 Personal history of other malignant neoplasm of skin: Secondary | ICD-10-CM | POA: Insufficient documentation

## 2023-05-23 DIAGNOSIS — Z1231 Encounter for screening mammogram for malignant neoplasm of breast: Secondary | ICD-10-CM | POA: Insufficient documentation

## 2023-05-23 DIAGNOSIS — L821 Other seborrheic keratosis: Secondary | ICD-10-CM

## 2023-05-23 DIAGNOSIS — L568 Other specified acute skin changes due to ultraviolet radiation: Secondary | ICD-10-CM

## 2023-05-23 NOTE — Progress Notes (Signed)
Dermatology Clinic, [facility]  [address]  [place] [address]  [phone]    Date:   05/23/2023  Name: Natasha Perez  Age: 52 y.o.    Chief complaint: Skin Lesions      HPI   Natasha Perez is a 52 y.o.  female presenting for a Skin Lesions. Hx of melanoma in 2011. Hx of SCC of left check s/p MOHs. Notes a lesion on her nose that was previously frozen at last visit but has reoccurred. She also notes few pink rough lesions on her forehead. No other new, changing, bleeding, or symptomatic lesions or moles. No other skin-related complaints. Patient tries to be careful in the sun but does have a hx of significant sun exposure.     Review of Systems   Constitutional:  Negative for chills and fever.   Skin:  Negative for itching and rash.     Current Medications  Outpatient Encounter Medications as of 05/23/2023   Medication Sig Dispense Refill    cyclobenzaprine (FLEXERIL) 10 mg Oral Tablet Take 1 Tablet (10 mg total) by mouth Three times a day (Patient taking differently: Take 1 Tablet (10 mg total) by mouth Every 8 hours as needed) 30 Tablet 1    HYDROcodone-acetaminophen (NORCO) 5-325 mg Oral Tablet Take 1 Tablet by mouth Every 8 hours as needed for Pain (7-10/10. Don't drive or operate machinery after taking medication. May make you more likely to fall.) 10 Tablet 0    Ibuprofen (MOTRIN) 800 mg Oral Tablet Take 1 Tablet (800 mg total) by mouth Three times a day as needed for Pain 90 Tablet 4    losartan-hydrochlorothiazide (HYZAAR) 50-12.5 mg Oral Tablet TAKE ONE TABLET BY MOUTH ONCE DAILY. 90 Tablet 4    ondansetron (ZOFRAN ODT) 4 mg Oral Tablet, Rapid Dissolve Take 1 tablet by mouth every 8 hours as needed for nausea 90 Tablet 0    ondansetron (ZOFRAN ODT) 4 mg Oral Tablet, Rapid Dissolve Dissolve 1 tablet on the tongue every 8 hours as needed for nausea 90 Tablet 0    [DISCONTINUED] lisinopriL (PRINIVIL) 10 mg Oral Tablet Take 1 Tablet (10 mg total) by mouth  Twice daily for 30 days 90 Tablet 3    [DISCONTINUED] losartan (COZAAR) 25 mg Oral Tablet Take 1 Tablet (25 mg total) by mouth Once a day 90 Tablet 1     No facility-administered encounter medications on file as of 05/23/2023.      Allergies   Allergen Reactions    Iodine And Iodide Containing Products Itching, Swelling and Hives/ Urticaria    Sulfa (Sulfonamides) Nausea/ Vomiting      Past Medical History:   Diagnosis Date    Cancer (CMS HCC)     melonoma    HTN (hypertension) 02/16/2021    Infertility management 08/12/2013    Plantar fasciitis      Patient Active Problem List    Diagnosis Date Noted    HTN (hypertension) 02/16/2021    Class 2 obesity with body mass index (BMI) of 35.0 to 35.9 in adult 02/16/2021    Infertility management 08/12/2013    History of ectopic pregnancy 08/12/2013    History of melanoma 05/27/2010    Fibroid 05/27/2010    Vitamin D deficiency 05/27/2010       Physical Exam  Vitals:    Temp:  [36.2 C (97.2 F)] 36.2 C (97.2 F) (08/28 0827)   Vitals:    05/23/23 0827   Temp:  36.2 C (97.2 F)   TempSrc: Temporal   Weight: 110 kg (241 lb 10 oz)   Height: 1.778 m (5\' 10" )     Physical Exam  Constitutional:       General: She is not in acute distress.     Appearance: Normal appearance.   HENT:      Head:     Eyes:      General: Lids are normal.   Skin:     General: Skin is warm and dry.      Coloration: Skin is not pale.          Neurological:      Mental Status: She is alert and oriented to person, place, and time. Mental status is at baseline.      Coordination: Coordination normal.   Psychiatric:         Mood and Affect: Mood normal.     Assessment and Plan    1. Hx of SCC (#1 on head diagram)  - S/p MOHs  - Well healed scar.   --No reoccurrence noted   -Will follow       2. 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  Discussed application and patient aware that irritation, crusting, ulceration, and pain can occur and is an expected result of treatment.  If it becomes too painful, hold therapy for 3-5 days, and then re-start. Informed that moisturizing with Vaseline daily and using ice can help with pain and irritation.  Patient instructed to call if any problems arise.    - Instructed patient this cream will cause increased sun sensitivity and advised patient to limit sun exposure.    3. 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  The condition is usually asymptomatic, and unless disturbed, most seborrheic keratoses persist and grow slowly.  No intervention is indicated at this time.  I will continue to follow the patient.         4. Sun Exposure  Skin Cancer Screening   - Recommend protection from ultraviolet radiation from the sun and avoidance of sunburns and tanning beds. Recommend broad spectrum sunscreen daily with at least SPF 30, especially one that contains zinc and titanium. Reapply sunscreen every two hours. It is important to wear protective clothing and avoid the mid-day sun (approx 10AM till 2PM) when possible   - Recommend self-assessment using the ABCDE's of melanoma to locate areas of concern: Asymmetry, Border irregularity, Color variation, Diameter (>48mm), Evolution of lesion.     RTC in 6 months or sooner if needed.     This visit was rendered independently with a supervising physician on site.     I am scribing for, and in the presence of,  Natasha [employer], FNP-BC, for services provided on 05/23/2023.  Natasha  Perez, SCRIBE     I personally performed the services described in this documentation, as scribed  in my presence, and it is both accurate  and complete.    [employer], APRN,FNP-BC

## 2023-05-24 ENCOUNTER — Encounter (HOSPITAL_BASED_OUTPATIENT_CLINIC_OR_DEPARTMENT_OTHER): Payer: Self-pay | Admitting: Family

## 2023-05-29 DIAGNOSIS — Z1231 Encounter for screening mammogram for malignant neoplasm of breast: Secondary | ICD-10-CM

## 2023-06-06 ENCOUNTER — Encounter (HOSPITAL_BASED_OUTPATIENT_CLINIC_OR_DEPARTMENT_OTHER): Payer: Self-pay | Admitting: Family

## 2023-06-18 ENCOUNTER — Encounter (HOSPITAL_BASED_OUTPATIENT_CLINIC_OR_DEPARTMENT_OTHER): Payer: Self-pay | Admitting: Family

## 2023-06-18 ENCOUNTER — Other Ambulatory Visit: Payer: Self-pay

## 2023-06-18 DIAGNOSIS — N3000 Acute cystitis without hematuria: Secondary | ICD-10-CM

## 2023-06-18 MED ORDER — NITROFURANTOIN MONOHYDRATE/MACROCRYSTALS 100 MG CAPSULE
100.0000 mg | ORAL_CAPSULE | Freq: Two times a day (BID) | ORAL | 0 refills | Status: DC
Start: 2023-06-18 — End: 2023-06-18

## 2023-06-18 MED ORDER — NITROFURANTOIN MONOHYDRATE/MACROCRYSTALS 100 MG CAPSULE
100.0000 mg | ORAL_CAPSULE | Freq: Two times a day (BID) | ORAL | 0 refills | Status: AC
Start: 2023-06-18 — End: 2023-06-25
  Filled 2023-06-18: qty 14, 7d supply, fill #0

## 2023-06-18 NOTE — Telephone Encounter (Signed)
Patient asking for medication for UTI.     Last visit 10/04/22    Do you want the patient to make an appointment?     Suzanne Boron, RN

## 2023-07-09 ENCOUNTER — Other Ambulatory Visit: Payer: Self-pay

## 2023-07-09 ENCOUNTER — Other Ambulatory Visit (HOSPITAL_BASED_OUTPATIENT_CLINIC_OR_DEPARTMENT_OTHER): Payer: Self-pay | Admitting: Family

## 2023-07-09 DIAGNOSIS — I10 Essential (primary) hypertension: Secondary | ICD-10-CM

## 2023-07-10 ENCOUNTER — Other Ambulatory Visit: Payer: Self-pay

## 2023-08-10 ENCOUNTER — Ambulatory Visit (HOSPITAL_BASED_OUTPATIENT_CLINIC_OR_DEPARTMENT_OTHER): Payer: Self-pay | Admitting: Family

## 2023-08-10 DIAGNOSIS — R0781 Pleurodynia: Secondary | ICD-10-CM

## 2023-08-10 NOTE — Telephone Encounter (Signed)
Pt cancelled her wellness on 05/23/23    Last visit on 10/04/22 to establish care.  FOLLOW UP:   Return in about 1 year (around 10/05/2023) for wellness visit.    Pended letter for your approval.    Suzanne Boron, RN

## 2023-08-10 NOTE — Telephone Encounter (Signed)
Regarding: Clinical Question  ----- Message from Hillery Jacks T sent at 08/10/2023  1:56 PM EST -----  Copied From CRM #[id number].Penza, Mechel L called asking for a letter stating she had a physical and can work. She is wanting to stop by the office to pick it up

## 2023-08-12 NOTE — Telephone Encounter (Signed)
Wrong date on letter, she established care in January, if this visit works for what she needs, then please change date.   She cancelled her wellness visit in August.   Thanks  [name]

## 2023-08-13 NOTE — Telephone Encounter (Signed)
Letter fixed with correct dates.    Lvm for patient to return call.  Suzanne Boron, RN

## 2023-08-14 NOTE — Telephone Encounter (Signed)
Lvm for patient to return call.  Suzanne Boron, RN

## 2023-08-16 NOTE — Telephone Encounter (Signed)
Patient wants the letter to not state establish care and just that she was seen here and eligible to work without restrictions for the January date.    Revised letter for your approval.     Suzanne Boron, RN

## 2023-08-16 NOTE — Telephone Encounter (Signed)
Okay to sent this letter  Do I need to do anything with the letter?   Thanks  Em

## 2023-08-17 NOTE — Telephone Encounter (Signed)
Letter sent to patient via MyChart  Suzanne Boron, RN

## 2023-08-21 ENCOUNTER — Other Ambulatory Visit: Payer: Self-pay

## 2023-08-21 ENCOUNTER — Ambulatory Visit
Admission: RE | Admit: 2023-08-21 | Discharge: 2023-08-21 | Disposition: A | Discharge status: Home / Self Care | Payer: Self-pay | Source: Ambulatory Visit | Attending: Family | Admitting: Family

## 2023-08-21 ENCOUNTER — Encounter (HOSPITAL_BASED_OUTPATIENT_CLINIC_OR_DEPARTMENT_OTHER): Payer: Self-pay | Admitting: Family

## 2023-08-21 DIAGNOSIS — R0781 Pleurodynia: Secondary | ICD-10-CM | POA: Insufficient documentation

## 2023-08-21 NOTE — Telephone Encounter (Signed)
Asking for right rib xray     Pended for your approval     Alphia Moh, RN

## 2023-08-22 DIAGNOSIS — R0781 Pleurodynia: Secondary | ICD-10-CM

## 2023-09-11 ENCOUNTER — Telehealth (HOSPITAL_BASED_OUTPATIENT_CLINIC_OR_DEPARTMENT_OTHER): Payer: Self-pay | Admitting: Family

## 2023-09-11 ENCOUNTER — Other Ambulatory Visit: Payer: Self-pay

## 2023-09-11 ENCOUNTER — Other Ambulatory Visit (HOSPITAL_BASED_OUTPATIENT_CLINIC_OR_DEPARTMENT_OTHER): Payer: Self-pay | Admitting: Family

## 2023-09-11 DIAGNOSIS — N946 Dysmenorrhea, unspecified: Secondary | ICD-10-CM

## 2023-09-11 DIAGNOSIS — I10 Essential (primary) hypertension: Secondary | ICD-10-CM

## 2023-09-11 DIAGNOSIS — Z8619 Personal history of other infectious and parasitic diseases: Secondary | ICD-10-CM

## 2023-09-11 MED ORDER — IBUPROFEN 800 MG TABLET
800.0000 mg | ORAL_TABLET | Freq: Three times a day (TID) | ORAL | 4 refills | Status: AC | PRN
Start: 2023-09-11 — End: ?
  Filled 2023-09-11: qty 90, 30d supply, fill #0
  Filled 2024-01-05: qty 90, 30d supply, fill #1

## 2023-09-11 NOTE — Telephone Encounter (Signed)
Covering for [name]  Please confirm with patient how she is taking this medication on a routine basis. There are multiple prescriptions with different directions in her chart. Thank you,  [name]

## 2023-09-11 NOTE — Telephone Encounter (Signed)
Refill Request: [facility]  Pharmacy requests refill of Valacyclovir 500 mg.    1.  Last visit at [facility]:  Visit date not found  2.  Last My Telemed/ My Chart Visit: Visit date not found   3.  Next pending visit [facility]: Visit date not found  4.  Was last visit within the past year: Yes  5.  If not seen within the last year was appointment scheduled: n/a  6.  [facility]    Medication pended to provider for approval.    Dionne Milo, Ambulatory Care Assistant 09/11/2023, 17:42

## 2023-09-11 NOTE — Telephone Encounter (Signed)
Refill Request: [facility]  Pharmacy requests refill of Ibuprofen.    1.  Last visit at [facility]:  10/04/2022  2.  Last My Telemed/ My Chart Visit: Visit date not found   3.  Next pending visit [facility]: Visit date not found  4.  Was last visit within the past year: Yes  5.  If not seen within the last year was appointment scheduled: N/A  6.  [facility]    Medication pended to provider for approval.    Dionne Milo, Ambulatory Care Assistant 09/11/2023, 17:34

## 2023-09-12 ENCOUNTER — Other Ambulatory Visit: Payer: Self-pay

## 2023-09-12 MED ORDER — VALACYCLOVIR 500 MG TABLET
500.0000 mg | ORAL_TABLET | Freq: Three times a day (TID) | ORAL | 0 refills | Status: DC | PRN
Start: 2023-09-12 — End: 2024-01-05
  Filled 2023-09-12: qty 90, 30d supply, fill #0

## 2023-09-12 NOTE — Telephone Encounter (Signed)
Thanks for checking. I just wanted to make sure she was only taking short term for flares since the TID dosing would be pretty high for a chronic dose. Sent rx to pharmacy with 90 tabs to keep on hand  Thanks  [name], MD  09/12/2023, 12:15   This document was generated using a voice recognition system and transcription

## 2023-09-12 NOTE — Telephone Encounter (Signed)
Called patient and gave her Dr. Albertha Ghee recommendations, patient gave verbal understanding.  Suzanne Boron, RN

## 2023-09-12 NOTE — Telephone Encounter (Signed)
Per dispense report patient has not had this medication filled from 02/24/23 to current.     [facility] last dispense valtrex 1gm tid x 7 days on 10/14/20, it was written for 90 tablets though.    Called patient she states she travels a lot and has had this prescription transferred to [place] [employer] with traveling. Patient states that she is taking these for cold sores fever blisters and gets these from stress.     Per pt Isaiah Blakes. Sent in the 1gm by accident and she can not take those their to big.     Patient states that she likes to have it on hand for when she gets the "tingling" that the cold sores is coming on. Patient takes them for 7-14 days at a time depending how bad they are .    Suzanne Boron, RN

## 2023-09-12 NOTE — Telephone Encounter (Signed)
 LVM for patient to return call.     Alphia Moh, RN

## 2023-09-17 ENCOUNTER — Other Ambulatory Visit: Payer: Self-pay

## 2024-01-05 ENCOUNTER — Other Ambulatory Visit (HOSPITAL_BASED_OUTPATIENT_CLINIC_OR_DEPARTMENT_OTHER): Payer: Self-pay | Admitting: Family Medicine

## 2024-01-05 ENCOUNTER — Other Ambulatory Visit: Payer: Self-pay

## 2024-01-05 ENCOUNTER — Other Ambulatory Visit (HOSPITAL_BASED_OUTPATIENT_CLINIC_OR_DEPARTMENT_OTHER): Payer: Self-pay | Admitting: Family

## 2024-01-05 DIAGNOSIS — N946 Dysmenorrhea, unspecified: Secondary | ICD-10-CM

## 2024-01-05 DIAGNOSIS — I10 Essential (primary) hypertension: Secondary | ICD-10-CM

## 2024-01-05 DIAGNOSIS — M62838 Other muscle spasm: Secondary | ICD-10-CM

## 2024-01-07 ENCOUNTER — Other Ambulatory Visit (HOSPITAL_BASED_OUTPATIENT_CLINIC_OR_DEPARTMENT_OTHER): Payer: Self-pay | Admitting: Family

## 2024-01-07 ENCOUNTER — Encounter (HOSPITAL_BASED_OUTPATIENT_CLINIC_OR_DEPARTMENT_OTHER): Payer: Self-pay | Admitting: Family

## 2024-01-07 NOTE — Telephone Encounter (Deleted)
 Refill Request: [facility]  Patient requests refill of losartan-hydrochlorothiazide.    1.  Last visit at [facility]:  10/04/2022  2.  Last My Telemed/ My Chart Visit: Visit date not found   3.  Next pending visit [facility]: 02/06/2024  4.  Was last visit within the past year: no  5.  If not seen within the last year was appointment scheduled: 02/06/2024  6.  Requested Pharmacy: [facility] [phone]    Medication pended to provider for approval.    Natasha Losh, LPN 7/84/6962, 95:28

## 2024-01-07 NOTE — Telephone Encounter (Signed)
 Refill Request: [facility]  Pharmacy requests refill of losartan-hydrochlorothiazide, Flexeril.    1.  Last visit at [facility]:  10/04/2022  2.  Last My Telemed/ My Chart Visit: Visit date not found   3.  Next pending visit [facility]: 02/06/2024  4.  Was last visit within the past year: no  5.  If not seen within the last year was appointment scheduled: 02/06/2024  6.  [facility]    Medication pended to provider for approval.    Natasha Wherry, LPN 1/61/0960, 45:40

## 2024-01-08 ENCOUNTER — Other Ambulatory Visit: Payer: Self-pay

## 2024-01-08 DIAGNOSIS — M62838 Other muscle spasm: Secondary | ICD-10-CM

## 2024-01-08 MED ORDER — CYCLOBENZAPRINE 10 MG TABLET
10.0000 mg | ORAL_TABLET | Freq: Three times a day (TID) | ORAL | 1 refills | Status: AC
Start: 2024-01-08 — End: ?
  Filled 2024-01-08: qty 30, 10d supply, fill #0

## 2024-01-08 MED ORDER — LOSARTAN 50 MG-HYDROCHLOROTHIAZIDE 12.5 MG TABLET
1.0000 | ORAL_TABLET | Freq: Every day | ORAL | 0 refills | Status: AC
Start: 2024-01-08 — End: ?
  Filled 2024-01-08: qty 90, 90d supply, fill #0

## 2024-01-08 MED ORDER — VALACYCLOVIR 500 MG TABLET
500.0000 mg | ORAL_TABLET | Freq: Three times a day (TID) | ORAL | 0 refills | Status: AC | PRN
Start: 2024-01-08 — End: ?
  Filled 2024-01-08: qty 90, 30d supply, fill #0

## 2024-01-08 NOTE — Telephone Encounter (Signed)
 Refill Request: [facility]   Pharmacy requests refill of valacyclovir.    1.  Last visit at [facility]:  10/04/2022  2.  Last My Telemed/ My Chart Visit: Visit date not found   3.  Next pending visit [facility]: 02/06/2024  4.  Was last visit within the past year: Yes  5.  If not seen within the last year was appointment scheduled: N/A  6.  [facility]    Medication pended to provider for approval.    Errik Mitchelle G. Theodora Fish, LPN 9/62/9528, 41:32

## 2024-01-09 ENCOUNTER — Other Ambulatory Visit: Payer: Self-pay

## 2024-01-10 ENCOUNTER — Other Ambulatory Visit: Payer: Self-pay

## 2024-01-31 ENCOUNTER — Encounter (HOSPITAL_COMMUNITY): Payer: Self-pay

## 2024-02-05 ENCOUNTER — Ambulatory Visit (HOSPITAL_BASED_OUTPATIENT_CLINIC_OR_DEPARTMENT_OTHER): Payer: Self-pay | Admitting: Dermatology

## 2024-02-06 ENCOUNTER — Other Ambulatory Visit: Payer: Self-pay

## 2024-02-06 ENCOUNTER — Ambulatory Visit (HOSPITAL_BASED_OUTPATIENT_CLINIC_OR_DEPARTMENT_OTHER): Payer: Self-pay | Admitting: Family

## 2024-02-06 ENCOUNTER — Ambulatory Visit: Payer: Self-pay | Attending: Dermatology | Admitting: Student in an Organized Health Care Education/Training Program

## 2024-02-06 VITALS — Temp 97.3°F | Ht 70.5 in | Wt 248.0 lb

## 2024-02-06 DIAGNOSIS — L568 Other specified acute skin changes due to ultraviolet radiation: Secondary | ICD-10-CM

## 2024-02-06 DIAGNOSIS — L309 Dermatitis, unspecified: Secondary | ICD-10-CM | POA: Insufficient documentation

## 2024-02-06 DIAGNOSIS — Z85820 Personal history of malignant melanoma of skin: Secondary | ICD-10-CM | POA: Insufficient documentation

## 2024-02-06 DIAGNOSIS — I781 Nevus, non-neoplastic: Secondary | ICD-10-CM | POA: Insufficient documentation

## 2024-02-06 DIAGNOSIS — D229 Melanocytic nevi, unspecified: Secondary | ICD-10-CM | POA: Insufficient documentation

## 2024-02-06 DIAGNOSIS — Z85828 Personal history of other malignant neoplasm of skin: Secondary | ICD-10-CM | POA: Insufficient documentation

## 2024-02-06 DIAGNOSIS — L821 Other seborrheic keratosis: Secondary | ICD-10-CM | POA: Insufficient documentation

## 2024-02-06 DIAGNOSIS — Z1283 Encounter for screening for malignant neoplasm of skin: Secondary | ICD-10-CM

## 2024-02-06 MED ORDER — CLOBETASOL 0.05 % TOPICAL OINTMENT
TOPICAL_OINTMENT | Freq: Two times a day (BID) | CUTANEOUS | 0 refills | Status: AC
Start: 2024-02-06 — End: 2024-02-20

## 2024-02-06 MED ORDER — TACROLIMUS 0.1 % TOPICAL OINTMENT
TOPICAL_OINTMENT | Freq: Two times a day (BID) | CUTANEOUS | 1 refills | Status: AC
Start: 2024-02-06 — End: ?

## 2024-02-06 NOTE — Telephone Encounter (Signed)
 Specialty Pharmacy Note    Prior Authorization for medication Tacrolimus is not required by payor [facility].  As requested by patient, prescription will be sent to [facility] pharmacy. If you have any questions don't hesitate to contact the Specialty Pharmacy Arline Bennett, Pharmacy Technician

## 2024-02-06 NOTE — Progress Notes (Signed)
 Dermatology, [facility]  [address]  [place] [address]  [phone]    Date:   02/06/2024  Name: Natasha Perez  Age: 53 y.o.    Chief complaint: Skin Lesions    HPI   Natasha Perez is a 53 y.o.  female with hx of melanoma in 2001, removed at [facility], patient had reactive node and sentinel nodes removed. Hx of SCC of left check s/p MOHs. Today, patient here for total body skin exam with no specific areas of concern. No other new, changing, bleeding, or symptomatic lesions or moles. No other skin-related complaints. Patient tries to be careful in the sun but does have a hx of significant sun exposure. Denies unexplained weight loss, fevers, chills.     Review of Systems   Constitutional:  Negative for chills and fever.   Skin:  Negative for itching and rash.     Current Medications  Outpatient Encounter Medications as of 02/06/2024   Medication Sig Dispense Refill    cyclobenzaprine  (FLEXERIL ) 10 mg Oral Tablet Take 1 Tablet (10 mg total) by mouth Three times a day 30 Tablet 1    Ibuprofen  (MOTRIN ) 800 mg Oral Tablet Take 1 Tablet (800 mg total) by mouth Three times a day as needed for Pain 90 Tablet 4    losartan -hydrochlorothiazide  (HYZAAR ) 50-12.5 mg Oral Tablet Take 1 Tablet by mouth Daily 90 Tablet 0    ondansetron  (ZOFRAN  ODT) 4 mg Oral Tablet, Rapid Dissolve Dissolve 1 tablet on the tongue every 8 hours as needed for nausea 90 Tablet 0    valACYclovir  (VALTREX ) 500 mg Oral Tablet Take 1 Tablet (500 mg total) by mouth Three times a day as needed 90 Tablet 0    [DISCONTINUED] lisinopriL  (PRINIVIL ) 10 mg Oral Tablet Take 1 Tablet (10 mg total) by mouth Twice daily for 30 days 90 Tablet 3    [DISCONTINUED] losartan  (COZAAR ) 25 mg Oral Tablet Take 1 Tablet (25 mg total) by mouth Once a day 90 Tablet 1    [DISCONTINUED] ondansetron  (ZOFRAN  ODT) 4 mg Oral Tablet, Rapid Dissolve Take 1 tablet by mouth every 8 hours as needed for nausea 90 Tablet 0     No  facility-administered encounter medications on file as of 02/06/2024.      Allergies   Allergen Reactions    Iodine And Iodide Containing Products Itching, Swelling and Hives/ Urticaria    Sulfa  (Sulfonamides) Nausea/ Vomiting      Past Medical History:   Diagnosis Date    Cancer (CMS HCC)     melonoma    HTN (hypertension) 02/16/2021    Infertility management 08/12/2013    Plantar fasciitis      Patient Active Problem List    Diagnosis Date Noted    HTN (hypertension) 02/16/2021    Class 2 obesity with body mass index (BMI) of 35.0 to 35.9 in adult 02/16/2021    Infertility management 08/12/2013    History of ectopic pregnancy 08/12/2013    History of melanoma 05/27/2010    Fibroid 05/27/2010    Vitamin D  deficiency 05/27/2010       Physical Exam  Vitals:    Temp:  [36.3 C (97.3 F)] 36.3 C (97.3 F) (05/14 0853)   Vitals:    02/06/24 0853   Temp: 36.3 C (97.3 F)   Weight: 113 kg (248 lb 0.3 oz)   Height: 1.791 m (5' 10.5")       Physical Exam  Constitutional:  General: She is not in acute distress.     Appearance: Normal appearance.   HENT:      Head:     Eyes:      General: Lids are normal.   Skin:     General: Skin is warm and dry.      Coloration: Skin is not pale.          Neurological:      Mental Status: She is alert and oriented to person, place, and time. Mental status is at baseline.      Coordination: Coordination normal.   Psychiatric:         Mood and Affect: Mood normal.     Assessment and Plan    #Hx of melanoma on the right leg (#2 on body diagram)  - Removed in 2001 at [facility]  - No recurrence, will follow  -No LAD    #Hx of SCC (#1 on head diagram)  - S/p MOHs  - Well healed scar  - No reoccurrence noted, will follow    #Seborrheic keratoses (#1 on body diagram)  - I discussed with the patient that a seborrheic keratosis is a common, benign epidermal lesion and that most patients develop at least one seborrheic keratosis in their lifetime.  The condition is usually asymptomatic, and unless  disturbed, most seborrheic keratoses persist and grow slowly.  No intervention is indicated at this time.  I will continue to follow the patient.       #Spider angiomas   - Patient will schedule follow-up for PDL  - Patient quoted $200     #Hand eczema  - Start clobetasol  0.05% ointment twice daily. Reviewed risk of skin atrophy with prolonged use. Refilled prescription.  - Start tacrolimus  0.1% ointment PRN to affected areas. Refilled prescription.     #Sun Exposure  Skin Cancer Screening   #Nevus (#1 on body diagram)  - Benign in appearance both grossly and on dermoscopy.  - Will follow.  - Recommend protection from ultraviolet radiation from the sun and avoidance of sunburns and tanning beds. Recommend broad spectrum sunscreen daily with at least SPF 30, especially one that contains zinc and titanium. Reapply sunscreen every two hours. It is important to wear protective clothing and avoid the mid-day sun (approx 10AM till 2PM) when possible   - Recommend self-assessment using the ABCDE's of melanoma to locate areas of concern: Asymmetry, Border irregularity, Color variation, Diameter (>66mm), Evolution of lesion.     RTC in 12 months or sooner if needed.     I am scribing for, and in the presence of, Dr. Marita Sidle for services provided on 02/06/2024.  Natasha Perez, SCRIBE     I personally performed the services described in this documentation, as scribed  in my presence, and it is both accurate  and complete.    Layvonne Primas, MD            See resident's note for details.  I saw and examined the patient and agree with the resident's findings and plan as written except as noted.  For minor surgical procedures (lasting less than five minutes), I was present during the entire service. For major procedures (lasting more than five minutes),  I was  physically present during the key portion(s) of the service including but not limited to discussing risks and benefits with pt, planning and drawing the  surgical plan and excision of the lesion  I was immediately available to furnish service during the  entire procedure.  Caralyn Chandler, MD

## 2024-02-06 NOTE — Telephone Encounter (Signed)
 Prior authorization required for prescription Tacrolimus received by Specialty Pharmacy on 02/06/2024.  Benefits investigation to be completed.

## 2024-02-07 ENCOUNTER — Ambulatory Visit (INDEPENDENT_AMBULATORY_CARE_PROVIDER_SITE_OTHER): Payer: Self-pay | Admitting: Family Medicine

## 2024-04-23 ENCOUNTER — Encounter (HOSPITAL_BASED_OUTPATIENT_CLINIC_OR_DEPARTMENT_OTHER): Payer: Self-pay | Admitting: Family

## 2024-04-23 DIAGNOSIS — Z1329 Encounter for screening for other suspected endocrine disorder: Secondary | ICD-10-CM

## 2024-04-23 DIAGNOSIS — I10 Essential (primary) hypertension: Secondary | ICD-10-CM

## 2024-04-23 DIAGNOSIS — Z1231 Encounter for screening mammogram for malignant neoplasm of breast: Secondary | ICD-10-CM

## 2024-04-23 DIAGNOSIS — Z131 Encounter for screening for diabetes mellitus: Secondary | ICD-10-CM

## 2024-04-23 DIAGNOSIS — E559 Vitamin D deficiency, unspecified: Secondary | ICD-10-CM

## 2024-04-23 DIAGNOSIS — Z13220 Encounter for screening for lipoid disorders: Secondary | ICD-10-CM

## 2024-04-23 DIAGNOSIS — E041 Nontoxic single thyroid nodule: Secondary | ICD-10-CM

## 2024-04-24 NOTE — Telephone Encounter (Signed)
 [place] and is requesting to do her labs prior to her appt    I pended for your review/approval     Olam, RN

## 2024-04-24 NOTE — Telephone Encounter (Signed)
 Pended mammogram for your approval  Lauraine Domino, RN

## 2024-04-25 NOTE — Telephone Encounter (Signed)
 Pt response back     She is also asking for a Endocrine referral, not sure who may have told her a referral is not needed but I will pend if you choose to approve.    She is also req to have the Thyroid  US  before the appt due to insurance benefit changing    Olam, Charity fundraiser

## 2024-04-28 NOTE — Telephone Encounter (Signed)
 I ordered the thyroid  ultrasound.   Referral to endocrinology is not necessary, but we will review this more at her appt.   Labs are fasting.   Thanks  Damien

## 2024-04-28 NOTE — Telephone Encounter (Signed)
 Regarding: Pt requests  ----- Message from Norman DEL sent at 04/25/2024  3:51 PM EDT -----  Natasha Perez,    I scheduled patients mammogram today for sept 2nd at [facility] after your appointment with her. She wanted me to message you regarding the following:    Needs Endocrine referral regarding a positive ultrasound for nodules and cysts tried to schedule a year ago but it didnt work out and would like new ultrasound thyroid  place if possible, and wants to know if we can add Vitmain D to the labwork ordered    Pt will complete lab work when she comes in to her appointment with you on the 2nd, and i let her know that her Metabolic panel does require fasting

## 2024-04-29 ENCOUNTER — Encounter (HOSPITAL_BASED_OUTPATIENT_CLINIC_OR_DEPARTMENT_OTHER): Payer: Self-pay

## 2024-05-22 ENCOUNTER — Encounter (HOSPITAL_BASED_OUTPATIENT_CLINIC_OR_DEPARTMENT_OTHER): Payer: Self-pay

## 2024-05-27 ENCOUNTER — Ambulatory Visit (HOSPITAL_COMMUNITY): Payer: Self-pay

## 2024-05-27 ENCOUNTER — Ambulatory Visit: Payer: Self-pay | Admitting: Family

## 2024-05-28 ENCOUNTER — Ambulatory Visit (HOSPITAL_BASED_OUTPATIENT_CLINIC_OR_DEPARTMENT_OTHER): Payer: Self-pay | Admitting: Family

## 2024-07-03 ENCOUNTER — Other Ambulatory Visit: Payer: Self-pay

## 2024-07-05 ENCOUNTER — Other Ambulatory Visit: Payer: Self-pay

## 2024-10-13 ENCOUNTER — Other Ambulatory Visit (HOSPITAL_BASED_OUTPATIENT_CLINIC_OR_DEPARTMENT_OTHER): Payer: Self-pay | Admitting: Family

## 2024-10-13 DIAGNOSIS — I10 Essential (primary) hypertension: Secondary | ICD-10-CM

## 2024-10-14 NOTE — Telephone Encounter (Signed)
 Pt has not been seen since 2024 with multiple cancellations, will not fill until she has an appt

## 2024-10-14 NOTE — Telephone Encounter (Signed)
 Refill Request: [facility]  Pharmacy requests refill of losartan -hydrochlorothiazide  (HYZAAR ) 50-12.5 mg Oral Tablet .    1.  Last visit at [facility]:  10/04/2022  2.  Last My Telemed/ My Chart Visit: Visit date not found   3.  Next pending visit [facility]: Visit date not found  4.  Was last visit within the past year: yes  5.  If not seen within the last year was appointment scheduled: na  6.  Requested Pharmacy: [facility]     Medication pended to provider for approval.    Con Hurst, LPN 8/79/7973, 91:86

## 2024-10-15 ENCOUNTER — Encounter (HOSPITAL_BASED_OUTPATIENT_CLINIC_OR_DEPARTMENT_OTHER): Payer: Self-pay

## 2025-01-20 ENCOUNTER — Ambulatory Visit (HOSPITAL_BASED_OUTPATIENT_CLINIC_OR_DEPARTMENT_OTHER): Payer: Self-pay | Admitting: Dermatology
